# Patient Record
Sex: Female | Born: 1960 | Race: White | Hispanic: No | Marital: Married
Health system: Midwestern US, Academic
[De-identification: ages and names within clinical notes are randomized; demographics above are authoritative.]

---

## 2019-03-31 ENCOUNTER — Encounter: Admit: 2019-03-31 | Discharge: 2019-03-31

## 2019-03-31 NOTE — Progress Notes
Records Request    Medical records request for continuation of care:    Patient has appointment on 04/05/2019   with  Dr. Dedra Skeens* .    Please fax records to Cardiovascular Medicine-[phone]    Request records: STAT        Cardiac Office Note (Most recent)    EKG's        Cardiac Catheterization       Stress Test    Echocardiogram    Any Cardiac Testing    Any cardiac-related records    H&P/Discharge Summary- Any cardiac related    Operative Reports- Cardiac- Mitral Valve Repair operative report (October 2017)        Thank you,      Cardiovascular Medicine  [facility]  [address]  [place], [place] [address]  Phone:  [phone]  Fax:  [phone]

## 2019-04-04 ENCOUNTER — Encounter: Admit: 2019-04-04 | Discharge: 2019-04-04

## 2019-04-04 DIAGNOSIS — G35 Multiple sclerosis: Secondary | ICD-10-CM

## 2019-04-04 DIAGNOSIS — F329 Major depressive disorder, single episode, unspecified: Secondary | ICD-10-CM

## 2019-04-04 DIAGNOSIS — E0500 Thyrotoxicosis with diffuse goiter without thyrotoxic crisis or storm: Secondary | ICD-10-CM

## 2019-04-04 DIAGNOSIS — Z9889 Other specified postprocedural states: Secondary | ICD-10-CM

## 2019-04-05 ENCOUNTER — Encounter: Admit: 2019-04-05 | Discharge: 2019-04-05

## 2019-04-05 ENCOUNTER — Ambulatory Visit: Admit: 2019-04-05 | Discharge: 2019-04-06

## 2019-04-05 DIAGNOSIS — Z9889 Other specified postprocedural states: Secondary | ICD-10-CM

## 2019-04-05 DIAGNOSIS — R0600 Dyspnea, unspecified: Secondary | ICD-10-CM

## 2019-04-05 DIAGNOSIS — F329 Major depressive disorder, single episode, unspecified: Secondary | ICD-10-CM

## 2019-04-05 DIAGNOSIS — G35 Multiple sclerosis: Secondary | ICD-10-CM

## 2019-04-05 DIAGNOSIS — I10 Essential (primary) hypertension: Secondary | ICD-10-CM

## 2019-04-05 DIAGNOSIS — E0500 Thyrotoxicosis with diffuse goiter without thyrotoxic crisis or storm: Secondary | ICD-10-CM

## 2019-04-05 NOTE — Progress Notes
Date of Service: 04/05/2019    Kristina Wong is a 58 y.o. female.       HPI     Kristina Wong is establishing care in the [place] area after previously living in [place].  She previously underwent mitral valve repair on August 04, 2016.  This involved use of a 27 mm Medtronic Durand Ancore annuloplasty ring with a mini sternotomy.  She is stable and reports no new symptoms.  She does report chronic dyspnea with exertion but this has been present unchanged for years.  She can walk a mile at a time and occasionally develops mild dyspnea with exertion on her walks.  She reports no congestive symptoms such as orthopnea, nocturnal dyspnea, or increase in edema.  For years she has noted trace to 1+ bilateral pedal edema which accumulates during the day.  She has not had to use (as needed) furosemide for many months.  Otherwise, The patient has been doing well and reports no angina, congestive symptoms, palpitations, sensation of sustained forceful heart pounding, lightheadedness or syncope.  Her exercise tolerance has been stable. The patient reports no claudication, myalgias, bleeding abnormalities, neurologic motor abnormalities or difficulty with speech.        Vitals:    04/05/19 1113 04/05/19 1132   BP: 116/84 122/88   BP Source: Arm, Left Upper Arm, Right Upper   Pulse: 74    SpO2: 98%    Weight: 94.8 kg (209 lb)    Height: 1.575 m (5' 2)    PainSc: Zero      Body mass index is 38.23 kg/m???.     Past Medical History  Patient Active Problem List    Diagnosis Date Noted   ??? Hypothyroid 04/04/2019   ??? Hyperlipemia 04/04/2019   ??? Essential hypertension 04/04/2019   ??? Generalized anxiety disorder 04/04/2019   ??? Chest pain 04/04/2019     07/14/16 Cardiac Cath:  Normal coronary artery anatomy.  Normal left ventricular systolic function.  Minimal elevation of right heart pressures.  By ventriculography, moderate mitral valve regurgitation       ??? Graves disease 04/04/2019   ??? Depression 04/04/2019 ??? Multiple sclerosis (HCC) 04/04/2019   ??? 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 Sherral Hammers           Review of Systems   Constitution: Negative.   HENT: Positive for tinnitus.    Eyes: Negative.    Cardiovascular: Negative.    Respiratory: Negative.    Endocrine: Negative.    Hematologic/Lymphatic: Bruises/bleeds easily.   Skin: Negative.    Musculoskeletal: Positive for arthritis and joint pain.   Gastrointestinal: Positive for heartburn.   Genitourinary: Negative.    Neurological: Negative.    Psychiatric/Behavioral: Negative.    Allergic/Immunologic: Positive for environmental allergies.       Physical Exam  GENERAL: The patient is well developed, well nourished, resting comfortably and in no distress.   HEENT: No abnormalities of the visible oro-nasopharynx, conjunctiva or sclera are noted.  NECK: There is no jugular venous distension. Carotids are palpable and without bruits. There is no thyroid enlargement.  Chest: Lung fields are clear to auscultation. There are no wheezes or crackles.  CV: There is a regular rhythm. The first and second heart sounds are normal.  A soft apical systolic murmur is heard.  There are no diastolic murmurs, gallops or rubs.  ABD: The  abdomen is soft and supple with normal bowel sounds. There is no hepatosplenomegaly, ascites, tenderness, masses or bruits.  Neuro: There are no focal motor defects. Ambulation is normal. Cognitive function appears normal.  Ext: There is trace to 1+ bipedal edema without evidence of deep vein thrombosis. Peripheral pulses are satisfactory.    SKIN: There are no rashes and no cellulitis  PSYCH: The patient is calm, rationale and oriented.    Cardiovascular Studies  A twelve-lead ECG was obtained on 04/05/2019 reveals normal sinus rhythm with a heart rate of 66 bpm.  Nonspecific ST-T wave abnormalities are noted that appear to show no significant difference when compared with her prior ECG obtained on 03/03/2019.    An outside echo Doppler study performed on 09/17/2016 following mitral valve repair revealed 1) left ventricular chamber and wall dimensions are normal.  2) the calculated left ventricular ejection fraction is 55%.  3) left ventricular systolic function is normal.  4) normal left atrial pressure and diastolic function.  5) mild mitral valve regurgitation without mitral valve stenosis.    Outside heart catheterization and coronary angiography performed on 07/14/2016 prior to mitral valve repair revealed: 1) normal coronary artery anatomy.  2) normal left ventricular systolic function.  3) minimal elevation of right heart pressures.  The right atrial pressure was 15 mmHg.  The right ventricular pressure was 43/13 mmHg.  The right pulmonary artery pressure was 42/23 mmHg with a mean of 31 mmHg.  The pulmonary catheter wedge pressure was 16 mmHg.  Left ventricular end-diastolic pressure was 19 mmHg.  The cardiac output by Fick determination was 6.8 L/min and by thermodilution 7.3 L/min. 4) moderate mitral valve regurgitation.    Problems Addressed Today  Encounter Diagnoses   Name Primary?   ??? S/P mitral valve repair Yes   ??? Essential hypertension    ??? DOE (dyspnea on exertion)        Assessment and Plan     Kristina Wong reports chronic dyspnea with exertion.  This may be related to deconditioning or her weight.  However, in order to evaluate her exercise tolerance and to assess for structural cardiac abnormalities, I have asked her to obtain an exercise echo Doppler study as soon as this test is available according to current ASE recommendations and hospital policy.  This test is not currently being performed according to COVID-19 pandemic recommendations.  I have asked her to return for follow-up in approximately 3 months time to follow her progress.   .       Current Medications (including today's revisions) ??? albuterol sulfate (PROAIR HFA) 90 mcg/actuation aerosol inhaler Inhale 2 puffs by mouth into the lungs as Needed.   ??? aspirin 325 mg tablet Take 325 mg by mouth daily. Take with food.   ??? atorvastatin (LIPITOR) 20 mg tablet Take 20 mg by mouth daily.   ??? bisacodyL (DULCOLAX) 5 mg tablet Take 5 mg by mouth every 24 hours as needed for Constipation.   ??? cholecalciferol (VITAMIN D-3) 1,000 units tablet Take 1,000 Units by mouth daily.   ??? desvenlafaxine (PRISTIQ) 50 mg tablet Take 50 mg by mouth daily.   ??? furosemide (LASIX) 20 mg tablet Take 20 mg by mouth daily as needed.   ??? levocetirizine 5 mg tab Take 1 tablet by mouth daily.   ??? levothyroxine (SYNTHROID) 100 mcg tablet Take 100 mcg by mouth daily 30 minutes before breakfast.   ??? meloxicam (MOBIC) 7.5 mg tablet Take 7.5 mg by mouth daily.   ???  metoprolol XL (TOPROL XL) 100 mg extended release tablet Take 100 mg by mouth daily.   ??? omeprazole DR (PRILOSEC) 40 mg capsule Take 40 mg by mouth daily before breakfast.   ??? polyethylene glycol 3350 (MIRALAX) 17 g packet Take 17 g by mouth as Needed.   ??? potassium chloride SR (K-DUR) 10 mEq tablet Take 10 mEq by mouth as Needed. Take with a meal and a full glass of water.   ??? tolterodine LA (DETROL LA) 4 mg capsule Take 4 mg by mouth daily.   ??? valsartan (DIOVAN) 80 mg tablet Take 80 mg by mouth daily.

## 2019-04-06 ENCOUNTER — Encounter: Admit: 2019-04-06 | Discharge: 2019-04-06

## 2019-04-07 ENCOUNTER — Encounter: Admit: 2019-04-07 | Discharge: 2019-04-07

## 2019-04-07 DIAGNOSIS — E785 Hyperlipidemia, unspecified: Secondary | ICD-10-CM

## 2019-04-07 DIAGNOSIS — R0600 Dyspnea, unspecified: Secondary | ICD-10-CM

## 2019-04-07 DIAGNOSIS — I10 Essential (primary) hypertension: Secondary | ICD-10-CM

## 2019-04-07 DIAGNOSIS — F411 Generalized anxiety disorder: Secondary | ICD-10-CM

## 2019-04-07 NOTE — Telephone Encounter
-----   Message from [facility] sent at 04/07/2019  2:22 PM CDT -----  Regarding: prob w/order  She needs to have the stress part of the order added, I was going to sched it & saw the note & central sched will only do the rest part since that is the only order in there, now they have to have both.

## 2019-05-03 ENCOUNTER — Encounter: Admit: 2019-05-03 | Discharge: 2019-05-03

## 2019-05-05 NOTE — Telephone Encounter
Pt still COVID positive.  She will be retested next week.  Stress test scheduled 7/22.  I advised pt we will need one negative test 24-48 hours prior,  asymptomatic and afebrile before test can be completed. Pt verbalizes understanding.

## 2019-05-13 ENCOUNTER — Encounter: Admit: 2019-05-13 | Discharge: 2019-05-13

## 2019-05-13 NOTE — Telephone Encounter
Pt called to say her COVID19 test she had yesterday was negative. She has been asked to go back for a repeat tomorrow 7/18. She is wondering if she still needs a COVID test for [place] on 7/20 for her ex echo on 7/22. I told her she does not need to get COVID test on 7/20 if she can quarantine until after the stress test is done. She says she can do that.

## 2019-05-18 ENCOUNTER — Ambulatory Visit: Admit: 2019-05-18 | Discharge: 2019-05-18

## 2019-05-18 ENCOUNTER — Encounter: Admit: 2019-05-18 | Discharge: 2019-05-18

## 2019-05-18 ENCOUNTER — Ambulatory Visit: Admit: 2019-05-18 | Discharge: 2019-05-19

## 2019-05-18 DIAGNOSIS — I10 Essential (primary) hypertension: Secondary | ICD-10-CM

## 2019-05-18 DIAGNOSIS — R0600 Dyspnea, unspecified: Secondary | ICD-10-CM

## 2019-05-18 DIAGNOSIS — E785 Hyperlipidemia, unspecified: Secondary | ICD-10-CM

## 2019-05-18 DIAGNOSIS — F411 Generalized anxiety disorder: Secondary | ICD-10-CM

## 2019-05-18 MED ORDER — PERFLUTREN LIPID MICROSPHERES 1.1 MG/ML IV SUSP
1-20 mL | Freq: Once | INTRAVENOUS | 0 refills | Status: CP | PRN
Start: 2019-05-18 — End: ?

## 2019-05-19 ENCOUNTER — Encounter: Admit: 2019-05-19 | Discharge: 2019-05-19

## 2019-05-19 NOTE — Telephone Encounter
-----   Message from Nehemiah Massed, MD sent at 05/19/2019  9:33 AM CDT -----  Sherlon Handing: Her echo looks pretty good.  Only trace mitral valve regurgitation.  Her mean mitral diastolic gradient is 7 mmHg.  It requires no action and we can watch this over time.  Please let her know.  Thanks.  SBG  ----- Message -----  From: Nehemiah Massed, MD  Sent: 05/19/2019   9:33 AM CDT  To: Nehemiah Massed, MD

## 2019-05-19 NOTE — Telephone Encounter
Results and recommendations called to patient.              Kristina Wong and Kristina Wong. Her stress test was submaximal although no major abnormalities were reported. Please let her know. Thanks. SBG

## 2019-07-14 ENCOUNTER — Encounter: Admit: 2019-07-14 | Discharge: 2019-07-14

## 2019-07-19 ENCOUNTER — Encounter: Admit: 2019-07-19 | Discharge: 2019-07-19

## 2019-07-19 ENCOUNTER — Ambulatory Visit: Admit: 2019-07-19 | Discharge: 2019-07-20

## 2019-07-19 NOTE — Progress Notes
Date of Service: 07/19/2019    Kristina Wong is a 58 y.o. female.       HPI     Ms. Wold recently moved to the [facility] area after previously living in [place].    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  She later tested negative in July 2020.   Currently, she is stable and reports no new symptoms.  She does report chronic dyspnea with exertion but this has been present unchanged for years.  She can walk a mile at a time and occasionally develops mild dyspnea with exertion on her walks.  She reports no congestive symptoms such as orthopnea, nocturnal dyspnea, or increase in edema.  For years she has noted trace to 1+ bilateral pedal edema which accumulates during the day.  She has not had to use (as needed) furosemide for many months.  Also she is hesitant to use supplemental furosemide because she has been told she cannot take supplemental potassium for some reason. Otherwise, The patient has been doing well and reports no angina, congestive symptoms, palpitations, sensation of sustained forceful heart pounding, lightheadedness or syncope.  Her exercise tolerance has been stable. The patient reports no claudication, myalgias, bleeding abnormalities, neurologic motor abnormalities or difficulty with speech.   Historically, Ms. Ramos previously underwent mitral valve repair on August 04, 2016.  This involved use of a 27 mm Medtronic Durand Ancore annuloplasty ring with a mini sternotomy.        Vitals:    07/19/19 0902 07/19/19 0910   BP: 116/72 126/78   BP Source: Arm, Left Upper Arm, Right Upper   Pulse: 74    Temp: 36.6 ?C (97.9 ?F)    SpO2: 97%    Weight: 98.1 kg (216 lb 3.2 oz)    Height: 1.6 m (5' 3)    PainSc: Zero      Body mass index is 38.3 kg/m?Marland Kitchen     Past Medical History  Patient Active Problem List    Diagnosis Date Noted   ? Hypothyroid 04/04/2019   ? Hyperlipemia 04/04/2019   ? Essential hypertension 04/04/2019 ? Generalized anxiety disorder 04/04/2019   ? Chest pain 04/04/2019     07/14/16 Cardiac Cath:  Normal coronary artery anatomy.  Normal left ventricular systolic function.  Minimal elevation of right heart pressures.  By ventriculography, moderate mitral valve regurgitation       ? Graves disease 04/04/2019   ? Depression 04/04/2019   ? Multiple sclerosis (HCC) 04/04/2019   ? 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 Sherral Hammers           Review of Systems   Constitution: Positive for malaise/fatigue.   HENT: Positive for tinnitus.    Eyes: Negative.    Cardiovascular: Positive for dyspnea on exertion and leg swelling.   Respiratory: Positive for wheezing.    Endocrine: Negative.    Hematologic/Lymphatic: Bruises/bleeds easily.   Skin: Negative.    Musculoskeletal: Positive for arthritis.   Gastrointestinal: Positive for constipation.   Genitourinary: Negative.    Neurological: Positive for numbness and paresthesias.   Psychiatric/Behavioral: Negative.    Allergic/Immunologic: Negative.        Physical Exam  GENERAL: The patient is well developed, well nourished, resting comfortably and in no distress.   HEENT: No abnormalities of the visible oro-nasopharynx, conjunctiva or sclera are  noted.  NECK: There is no jugular venous distension. Carotids are palpable and without bruits. There is no thyroid enlargement.  Chest: Lung fields are clear to auscultation. There are no wheezes or crackles.  CV: There is a regular rhythm. The first and second heart sounds are normal.  A soft apical systolic murmur is heard.  A soft diastolic rumble is also heard. There are no gallops or rubs.  ABD: The abdomen is soft and supple with normal bowel sounds. There is no hepatosplenomegaly, ascites, tenderness, masses or bruits.  Neuro: There are no focal motor defects. Ambulation is normal. Cognitive function appears normal. Ext: There is trace to 1+ bipedal edema without evidence of deep vein thrombosis. Peripheral pulses are satisfactory.    SKIN: There are no rashes and no cellulitis  PSYCH: The patient is calm, rationale and oriented    Cardiovascular Studies  A twelve-lead ECG was obtained on 04/05/2019 reveals normal sinus rhythm with a heart rate of 66 bpm.  Nonspecific ST-T wave abnormalities are noted that appear to show no significant difference when compared with her prior ECG obtained on 03/03/2019.  ?  An outside echo Doppler study performed on 09/17/2016 following mitral valve repair revealed 1) left ventricular chamber and wall dimensions are normal.  2) the calculated left ventricular ejection fraction is 55%.  3) left ventricular systolic function is normal.  4) normal left atrial pressure and diastolic function.  5) mild mitral valve regurgitation without mitral valve stenosis.  ?  Outside heart catheterization and coronary angiography performed on 07/14/2016 prior to mitral valve repair revealed: 1) normal coronary artery anatomy.  2) normal left ventricular systolic function.  3) minimal elevation of right heart pressures.  The right atrial pressure was 15 mmHg.  The right ventricular pressure was 43/13 mmHg.  The right pulmonary artery pressure was 42/23 mmHg with a mean of 31 mmHg.  The pulmonary catheter wedge pressure was 16 mmHg.  Left ventricular end-diastolic pressure was 19 mmHg.  The cardiac output by Fick determination was 6.8 L/min and by thermodilution 7.3 L/min. 4) moderate mitral valve regurgitation.    Echo Doppler 05/18/2019:  1. No regional wall motion abnormalities are seen. Overall LV systolic function appears normal. The estimated left ventricular ejection fraction is 60%.  2. Grade II (moderate) left ventricular diastolic dysfunction. Elevated left atrial pressure.  3. The right ventricle is not visualized well. Limited views suggest normal right ventricular size and contractility. 4. Left atrial chamber dimension appear grossly normal.  5. Prior mitral valve annuloplasty repair is noted.  The mean mitral diastolic gradient is approximately 7 mmHg suggesting mild mitral valve stenosis following mitral valve repair.  Trace mitral valve regurgitation is noted by Doppler exam.  6. No pericardial effusion is seen.    Stress echo 05/18/2019:  Baseline Information    Baseline HR  69 bpm       Baseline BP - Sys  124 mmHg       Baseline BP - Dias  70 mmHg       Percent HR  90 %       Target HR  147        Stress Information    Peak HR  97 bpm       Peak BP - Sys  132 mmHg       Peak BP - Dias  72       Percent of predicted max HR  60 %       Exercise  duration (min)  4 min       Exercise duration (sec)  3 sec       Estimated workload  7 METS          ? Technically difficult study; i.v. transpulmonary contrast was used to define the endocardial borders.  ? Normal left ventricular systolic function, estimated ejection fraction is 65 %.  ? Baseline EKG demonstrated normal sinus rhythm, no ST segment deviation noted during stress, the stress EKG is negative for ischemia.  ? Duke Treadmill score is 4, moderate risk, estimated 1 year mortality 1.0-1.1%.  ? This is a submaximal stress echo, patient attained only 60% of the maximum predicted target heart rate for age. No evidence of wall motion abnormalities at the workload achieved.  Low risk for cardiovascular complications      Problems Addressed Today  Mitral valve stenosis.  Dyspnea with exertion.  Assessment and Plan     Ms. Olsson has chronic dyspnea with exertion but it is stable and mild and she remains functional.  She has some degree of mitral valve stenosis following mitral valve repair with a resting mitral diastolic gradient of approximately 7 mmHg.  She does not require intervention at this time, especially since the technology for nonsurgical treatment of mitral valve disease is constantly evolving.  Some of her dyspnea with exertion may also be due to her weight and deconditioning.  I have asked her to return for follow-up in 6 months time.         Current Medications (including today's revisions)  ? albuterol sulfate (PROAIR HFA) 90 mcg/actuation aerosol inhaler Inhale 2 puffs by mouth into the lungs as Needed.   ? aspirin EC 81 mg tablet Take 81 mg by mouth daily. Take with food.   ? atorvastatin (LIPITOR) 20 mg tablet Take 20 mg by mouth daily.   ? bisacodyL (DULCOLAX) 5 mg tablet Take 5 mg by mouth every 24 hours as needed for Constipation.   ? cholecalciferol (VITAMIN D-3) 1,000 units tablet Take 1,000 Units by mouth daily.   ? desvenlafaxine (PRISTIQ) 50 mg tablet Take 50 mg by mouth daily.   ? furosemide (LASIX) 20 mg tablet Take 20 mg by mouth daily as needed.   ? levocetirizine 5 mg tab Take 1 tablet by mouth daily.   ? levothyroxine (SYNTHROID) 100 mcg tablet Take 100 mcg by mouth daily 30 minutes before breakfast.   ? meloxicam (MOBIC) 7.5 mg tablet Take 7.5 mg by mouth daily.   ? metoprolol XL (TOPROL XL) 100 mg extended release tablet Take 100 mg by mouth daily.   ? omeprazole DR (PRILOSEC) 40 mg capsule Take 40 mg by mouth daily before breakfast.   ? polyethylene glycol 3350 (MIRALAX) 17 g packet Take 17 g by mouth as Needed.   ? potassium chloride SR (K-DUR) 10 mEq tablet Take 10 mEq by mouth as Needed. Take with a meal and a full glass of water.   ? tolterodine LA (DETROL LA) 4 mg capsule Take 4 mg by mouth daily.   ? valsartan (DIOVAN) 80 mg tablet Take 80 mg by mouth daily.

## 2020-01-26 ENCOUNTER — Encounter: Admit: 2020-01-26 | Discharge: 2020-01-26

## 2020-01-26 DIAGNOSIS — I10 Essential (primary) hypertension: Secondary | ICD-10-CM

## 2020-01-26 DIAGNOSIS — F329 Major depressive disorder, single episode, unspecified: Secondary | ICD-10-CM

## 2020-01-26 DIAGNOSIS — E785 Hyperlipidemia, unspecified: Secondary | ICD-10-CM

## 2020-01-26 DIAGNOSIS — R079 Chest pain, unspecified: Secondary | ICD-10-CM

## 2020-01-26 DIAGNOSIS — Z9889 Other specified postprocedural states: Secondary | ICD-10-CM

## 2020-01-26 DIAGNOSIS — E0500 Thyrotoxicosis with diffuse goiter without thyrotoxic crisis or storm: Secondary | ICD-10-CM

## 2020-01-26 DIAGNOSIS — G35 Multiple sclerosis: Secondary | ICD-10-CM

## 2020-01-26 LAB — BASIC METABOLIC PANEL
Lab: 0.8
Lab: 10
Lab: 100
Lab: 108 — ABNORMAL HIGH (ref 98–107)
Lab: 140
Lab: 25 — ABNORMAL HIGH (ref 9.8–20.1)
Lab: 26
Lab: 4.4
Lab: 74
Lab: 8.8

## 2020-01-26 NOTE — Progress Notes
Date of Service: 01/26/2020    Kristina Wong is a 59 y.o. female.       HPI     Kristina Wong?recently moved to the [facility] area after previously living in [place]. ?  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  She later tested negative in July 2020.? Early in the morning of January 17, 2020 she woke up with vague sensation in her torso.  This sensation was mild and difficult for her to characterize and was not associated with dyspnea, diaphoresis or lightheadedness.  It was not suggestive for indigestion and was not pleuritic, positional or tender to touch.  This sensation was generally present throughout her entire torso and perhaps abdomen as well.  It did not radiate.  It persisted for approximately 10 minutes and resolved spontaneously.  It was not very concerning to her and she did not seek medical attention.  It has not recurred.  Currently, she is stable and reports no new symptoms. ?She does report chronic dyspnea with exertion but this has been present unchanged for years. ?She can walk a mile at a time and she occasionally?develops mild dyspnea with exertion on her walks.  She does believe that her strength and stamina is slowly improving following recovery from Covid.  She has now received her Covid vaccines as well. ?She reports no congestive symptoms such as orthopnea, nocturnal dyspnea, or increase in edema. ?For years she has noted trace to 1+ bilateral pedal edema which accumulates during the day.??She uses a furosemide 2-3 times per month for fluid retention. Otherwise, the patient has been doing well and reports no congestive symptoms, palpitations, sensation of sustained forceful heart pounding, lightheadedness or syncope.??Her exercise tolerance has been stable. The patient reports no?claudication,?myalgias, bleeding abnormalities, or strokelike symptoms.  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 ?This involved use of a 27 mm Medtronic Durand Ancore annuloplasty ring with a mini sternotomy.          Vitals:    01/26/20 0817   BP: 128/86   BP Source: Arm, Left Upper   Patient Position: Sitting   Pulse: 68   SpO2: 98%   Weight: 95.7 kg (211 lb)   Height: 1.6 m (5' 3)   PainSc: Zero     Body mass index is 37.38 kg/m?Marland Kitchen     Past Medical History  Patient Active Problem List    Diagnosis Date Noted   ? Hypothyroid 04/04/2019   ? Hyperlipemia 04/04/2019   ? Essential hypertension 04/04/2019   ? Generalized anxiety disorder 04/04/2019   ? Chest pain 04/04/2019     07/14/16 Cardiac Cath:  Normal coronary artery anatomy.  Normal left ventricular systolic function.  Minimal elevation of right heart pressures.  By ventriculography, moderate mitral valve regurgitation       ? Graves disease 04/04/2019   ? Depression 04/04/2019   ? Multiple sclerosis (HCC) 04/04/2019   ? 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 Sherral Hammers           Review of Systems   Constitution: Negative.   HENT: Negative.    Eyes: Negative.    Cardiovascular: Positive for dyspnea on exertion and leg swelling.   Endocrine: Negative.    Hematologic/Lymphatic: Negative.    Skin: Negative.    Musculoskeletal: Positive for arthritis and back  pain.   Gastrointestinal: Positive for constipation and heartburn.   Genitourinary: Negative.    Neurological: Positive for loss of balance.   Psychiatric/Behavioral: Negative.    Allergic/Immunologic: Negative.        Physical Exam  GENERAL: The patient is well developed, well nourished, resting comfortably and in no distress.   HEENT: No abnormalities of the visible oro-nasopharynx, conjunctiva or sclera are noted.  NECK: There is no jugular venous distension. Carotids are palpable and without bruits. There is no thyroid enlargement.  Chest: Lung fields are clear to auscultation. There are no wheezes or crackles.  CV: There is a regular rhythm. The first and second heart sounds are normal.??A soft apical systolic murmur is heard.  A soft diastolic rumble is also heard.?There are no?gallops or rubs.  ABD: The abdomen is soft and supple with normal bowel sounds. There is no hepatosplenomegaly, ascites, tenderness, masses or bruits.  Neuro: There are no focal motor defects. Ambulation is normal. Cognitive function appears normal.  Ext:?There is trace to 1+ bipedal?edema?without?evidence of deep vein thrombosis. Peripheral pulses are satisfactory. ?  SKIN:?There are no rashes and no cellulitis  PSYCH:?The patient is calm, rationale and oriented    Cardiovascular Studies  A twelve-lead ECG obtained on 01/26/2020 reveals normal sinus rhythm with a heart rate of 70 bpm.  There is no evidence for myocardial ischemia or infarction.  Labs from July 28, 2019 revealed serum potassium 3.3 mmol/L and serum creatinine 0.93 mg/dL.    An outside echo Doppler study performed on 09/17/2016 following mitral valve repair revealed 1) left ventricular chamber and wall dimensions are normal. ?2) the calculated left ventricular ejection fraction is 55%. ?3) left ventricular systolic function is normal. ?4) normal left atrial pressure and diastolic function. ?5) mild mitral valve regurgitation without mitral valve stenosis.  ?  Outside heart catheterization and coronary angiography performed on 07/14/2016 prior to mitral valve repair revealed: 1) normal coronary artery anatomy. ?2) normal left ventricular systolic function. ?3) minimal elevation of right heart pressures. ?The right atrial pressure was 15 mmHg. ?The right ventricular pressure was 43/13 mmHg. ?The right pulmonary artery pressure was 42/23 mmHg with a mean of 31 mmHg. ?The pulmonary catheter wedge pressure was 16 mmHg. ?Left ventricular end-diastolic pressure was 19 mmHg. ?The cardiac output by Fick determination was 6.8 L/min and by thermodilution 7.3 L/min.?4)?moderate mitral valve regurgitation.  ?  Echo Doppler 05/18/2019:  1. No regional wall motion abnormalities are seen. Overall LV systolic function appears normal. The estimated left ventricular ejection fraction is 60%.  2. Grade II (moderate) left ventricular diastolic dysfunction. Elevated left atrial pressure.  3. The right ventricle is not visualized well. Limited views suggest normal right ventricular size and contractility.  4. Left atrial chamber dimension appear grossly normal.  5. Prior mitral valve annuloplasty repair is noted. ?The mean mitral diastolic gradient is approximately 7 mmHg suggesting mild mitral valve stenosis following mitral valve repair. ?Trace mitral valve regurgitation is noted by Doppler exam.  6. No pericardial effusion is seen.  ?  Stress echo 05/18/2019:       Baseline Information     Baseline HR  69 bpm       Baseline BP - Sys  124 mmHg       Baseline BP - Dias  70 mmHg       Percent HR  90 %       Target HR  147        Stress Information  Peak HR  97 bpm       Peak BP - Sys  132 mmHg       Peak BP - Dias  72       Percent of predicted max HR  60 %       Exercise duration (min)  4 min       Exercise duration (sec)  3 sec       Estimated workload  7 METS          ? Technically difficult study; i.v. transpulmonary contrast was used to define the endocardial borders.  ? Normal left ventricular systolic function, estimated ejection fraction is 65 %.  ? Baseline EKG demonstrated normal sinus rhythm, no ST segment deviation noted during stress, the stress EKG is negative for ischemia.  ? Duke Treadmill score is 4, moderate risk, estimated 1 year mortality 1.0-1.1%.  ? This is a submaximal stress echo, patient attained only 60% of the maximum predicted target heart rate for age. No evidence of wall motion abnormalities at the workload achieved.  Low risk for cardiovascular complications    Problems Addressed Today  Encounter Diagnoses   Name Primary?   ? Chest pain, unspecified type Yes   ? Essential hypertension    ? Hyperlipidemia, unspecified hyperlipidemia type        Assessment and Plan     Ms. Mckain had today a vague nondiagnostic chest sensation on January 17, 2020 that has not recurred.  Alternatives for further evaluation were reviewed with the patient and she wanted to obtain a regadenoson thallium stress test to assess for objective evidence of myocardial ischemia.  I have asked that the study be obtained within the next week.  Her prior stress test was submaximal due to low treadmill performance.Ms. Lueth has chronic dyspnea with exertion but it is stable and mild and she remains functional.  She has some degree of mitral valve stenosis following mitral valve repair with a resting mitral diastolic gradient of approximately 7 mmHg.  She does not require intervention at this time, especially since the technology for nonsurgical treatment of mitral valve disease is constantly evolving.  Some of her dyspnea with exertion may also be due to her weight and deconditioning.  She was given a requisition to obtain a Chem-7 since she infrequently takes furosemide. I have asked her to return for follow-up in 3 months time.         Current Medications (including today's revisions)  ? albuterol sulfate (PROAIR HFA) 90 mcg/actuation aerosol inhaler Inhale 2 puffs by mouth into the lungs as Needed.   ? aspirin EC 81 mg tablet Take 81 mg by mouth daily. Take with food.   ? atorvastatin (LIPITOR) 20 mg tablet Take 20 mg by mouth daily.   ? bisacodyL (DULCOLAX) 5 mg tablet Take 5 mg by mouth every 24 hours as needed for Constipation.   ? cholecalciferol (VITAMIN D-3) 1,000 units tablet Take 1,000 Units by mouth daily.   ? desvenlafaxine (PRISTIQ) 50 mg tablet Take 50 mg by mouth daily.   ? furosemide (LASIX) 20 mg tablet Take 20 mg by mouth daily as needed.   ? levocetirizine 5 mg tab Take 1 tablet by mouth daily.   ? levothyroxine (SYNTHROID) 100 mcg tablet Take 100 mcg by mouth daily 30 minutes before breakfast.   ? meloxicam (MOBIC) 7.5 mg tablet Take 7.5 mg by mouth daily.   ? metoprolol XL (TOPROL XL) 100 mg extended release tablet Take 100 mg  by mouth daily.   ? omeprazole DR (PRILOSEC) 40 mg capsule Take 40 mg by mouth daily before breakfast.   ? polyethylene glycol 3350 (MIRALAX) 17 g packet Take 17 g by mouth as Needed.   ? potassium chloride SR (K-DUR) 10 mEq tablet Take 10 mEq by mouth as Needed. Take with a meal and a full glass of water.   ? tolterodine LA (DETROL LA) 4 mg capsule Take 4 mg by mouth daily.   ? valsartan (DIOVAN) 80 mg tablet Take 80 mg by mouth daily.

## 2020-01-26 NOTE — Patient Instructions
Thank you for visiting our office today.    Continue the same medications as you have been doing.          We will be pursuing the following tests after your appointment today:       Orders Placed This Encounter   ? BASIC METABOLIC PANEL   ? ECG Today (all locations)   ? REGADENOSON MPI STRESS TEST         We will plan to see you back in 3   months.  Please call us in the meantime with any questions or concerns.        Please allow 5-7 business days for our providers to review your results. All normal results will go to MyChart. If you do not have Mychart, it is strongly recommended to get this so you can easily view all your results. If you do not have mychart, we will attempt to call you once with normal lab and testing results. If we cannot reach you by phone with normal results, we will send you a letter.  If you have not heard the results of your testing after one week please give [place] a call.       Your [facility] [place] Brett Canales, Pilar Jarvis and [place])  phone number is [phone].             [facility] Nuclear Stress Test Instructions    PLEASE REPORT TO:    ____[facility] ([address], [address], [place], [place]) - [phone]   ____[facility] ([address], [address], [place], [place]) - [phone]    ____[facility] ([address] [place], [place]) - [phone]    ____[facility] ([address], [address], [place], [place]) - [phone]   ____[facility] ([address] [place], [place]) - [phone]     [facility] Main Phone Number: [phone]     Date of Test  ____________  at ____________  for ________________________    Are you able to raise your arm up by your head for about 20 minutes? yes  Can you lie on your back for approximately 20 minutes with minimal movement? yes    The Thallium evaluation has two parts -- two nuclear scans.  The first scan is done in the morning and the second three to four hours later.   Wear comfortable clothing. Shorts or pants. (No dresses or skirts please).  Bring or wear sneakers/walking shoes if you are walking on Treadmill.  Please let the nuclear technologists know if you plan on flying after the test.    NO CAFFEINE 24 HOURS PRIOR TO TEST. Examples: coffee, tea, decaf drinks, cola, chocolate.     DO NOT EAT OR DRINK THE MORNING OF YOUR TEST unless otherwise instructed. (You may have a couple sips of water).    If you are a diabetic, if insulin dependent: please take one third of your insulin with two pieces of dry toast and a small juice). Bring insulin and medication with you to the test.     ___ TAKE MORNING MEDICATIONS WITH A COUPLE SIPS OF WATER PRIOR TO TEST.     HOLD THE FOLLOWING MEDICATIONS AS INDICATED BELOW:      ?       WHAT TO DO BETWEEN THE FIRST TWO THALLIUM SCANS:  1. No strenuous exercise should be performed during this time.  2. A light lunch is permissible. The technologist will give you  a list of appropriate foods.  3. Please return 15 minutes prior to the schedule of your second scan. Our nuclear technologist will tell you exactly what time to return.  4. Please do not use tobacco products in between scans.  5. After the first scan is completed, you may resume usual medications.     TEST FINDINGS:  You will receive the results of the test within 7 business days of its completion by telephone, unless arranged differently at the time of the procedure.  If you have any questions concerning your thallium test or if you do not hear from your [facility] physician/or nurse within 7 business days, please call the appropriate office checked above.      Instructions given by Fulton Reek, RN

## 2020-01-30 ENCOUNTER — Encounter: Admit: 2020-01-30 | Discharge: 2020-01-30

## 2020-01-30 NOTE — Telephone Encounter
Called patient with stress test instructions who gave verbal understanding. NPO the morning of the exam and no caffeine for the 24 hours prior.

## 2020-02-01 ENCOUNTER — Ambulatory Visit: Admit: 2020-02-01 | Discharge: 2020-02-01

## 2020-02-01 ENCOUNTER — Encounter: Admit: 2020-02-01 | Discharge: 2020-02-01

## 2020-02-01 DIAGNOSIS — R079 Chest pain, unspecified: Secondary | ICD-10-CM

## 2020-02-01 DIAGNOSIS — I10 Essential (primary) hypertension: Secondary | ICD-10-CM

## 2020-02-01 DIAGNOSIS — E785 Hyperlipidemia, unspecified: Secondary | ICD-10-CM

## 2020-02-01 MED ORDER — REGADENOSON 0.4 MG/5 ML IV SYRG
.4 mg | Freq: Once | INTRAVENOUS | 0 refills | Status: CP
Start: 2020-02-01 — End: ?

## 2020-02-01 MED ORDER — EUCALYPTUS-MENTHOL MM LOZG
1 | Freq: Once | ORAL | 0 refills | Status: AC | PRN
Start: 2020-02-01 — End: ?

## 2020-02-01 MED ORDER — AMINOPHYLLINE 500 MG/20 ML IV SOLN
50 mg | INTRAVENOUS | 0 refills | Status: AC | PRN
Start: 2020-02-01 — End: ?

## 2020-02-01 MED ORDER — NITROGLYCERIN 0.4 MG SL SUBL
.4 mg | SUBLINGUAL | 0 refills | Status: DC | PRN
Start: 2020-02-01 — End: 2020-02-06

## 2020-02-01 MED ORDER — SODIUM CHLORIDE 0.9 % IV SOLP
250 mL | INTRAVENOUS | 0 refills | Status: AC | PRN
Start: 2020-02-01 — End: ?

## 2020-02-01 MED ORDER — ALBUTEROL SULFATE 90 MCG/ACTUATION IN HFAA
2 | RESPIRATORY_TRACT | 0 refills | Status: DC | PRN
Start: 2020-02-01 — End: 2020-02-06

## 2020-02-02 ENCOUNTER — Encounter: Admit: 2020-02-02 | Discharge: 2020-02-02

## 2020-02-02 NOTE — Telephone Encounter
Results and recommendations called to patient.

## 2020-05-08 ENCOUNTER — Encounter: Admit: 2020-05-08 | Discharge: 2020-05-08

## 2020-05-08 DIAGNOSIS — G35 Multiple sclerosis: Secondary | ICD-10-CM

## 2020-05-08 DIAGNOSIS — I342 Nonrheumatic mitral (valve) stenosis: Secondary | ICD-10-CM

## 2020-05-08 DIAGNOSIS — E0500 Thyrotoxicosis with diffuse goiter without thyrotoxic crisis or storm: Secondary | ICD-10-CM

## 2020-05-08 DIAGNOSIS — Z9889 Other specified postprocedural states: Secondary | ICD-10-CM

## 2020-05-08 DIAGNOSIS — F329 Major depressive disorder, single episode, unspecified: Secondary | ICD-10-CM

## 2020-05-08 NOTE — Progress Notes
Date of Service: 05/08/2020    Kristina Wong is a 59 y.o. female.       HPI     Kristina Wong?has been followed for mitral valve disease.??The patient is currently working at a retirement center and contracted COVID-19 in June 2020. ?She later tested negative in July 2020.? She has since received her Covid vaccines.  When I saw her on January 26, 2020 she reported nondiagnostic chest discomfort.  A stress test was obtained without significant abnormality.  Over the past 3 months she has had no recurrent chest discomfort.  She reports the majority of her blood pressure readings are less than 130/80 mmHg.  Otherwise, the patient has been doing well and reports no angina, congestive symptoms, palpitations, sensation of sustained forceful heart pounding, lightheadedness or syncope.  Her exercise tolerance has been stable, although she does not currently have an exercise routine. The patient reports no myalgias, bleeding abnormalities, or strokelike symptoms.  Historically,?Kristina Wong?previously underwent mitral valve repair on August 04, 2016. ?This involved use of a 27 mm Medtronic Durand Ancore annuloplasty ring with a mini sternotomy.?       Vitals:    05/08/20 1346 05/08/20 1347   BP: (!) 132/98 (!) 132/100   BP Source: Arm, Left Upper Arm, Right Upper   Patient Position: Sitting Sitting   Pulse: 79    SpO2: 98%    Weight: 93.4 kg (205 lb 12.8 oz)    Height: 1.6 m (5' 3)    PainSc: Four      Body mass index is 36.46 kg/m?Marland Kitchen     Past Medical History  Patient Active Problem List    Diagnosis Date Noted   ? Hypothyroid 04/04/2019   ? Hyperlipemia 04/04/2019   ? Essential hypertension 04/04/2019   ? Generalized anxiety disorder 04/04/2019   ? Chest pain 04/04/2019     07/14/16 Cardiac Cath:  Normal coronary artery anatomy.  Normal left ventricular systolic function.  Minimal elevation of right heart pressures.  By ventriculography, moderate mitral valve regurgitation       ? Graves disease 04/04/2019   ? Depression 04/04/2019 ? Multiple sclerosis (HCC) 04/04/2019   ? 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 Sherral Hammers           Review of Systems   Constitution: Negative.   HENT: Negative.    Eyes: Negative.    Cardiovascular: Negative.    Respiratory: Negative.    Endocrine: Negative.    Hematologic/Lymphatic: Negative.    Skin: Negative.    Musculoskeletal: Negative.    Gastrointestinal: Negative.    Genitourinary: Negative.    Neurological: Positive for dizziness.   Psychiatric/Behavioral: Negative.    Allergic/Immunologic: Negative.        Physical Exam  GENERAL: The patient is well developed, well nourished, resting comfortably and in no distress.   HEENT: No abnormalities of the visible oro-nasopharynx, conjunctiva or sclera are noted.  NECK: There is no jugular venous distension. Carotids are palpable and without bruits. There is no thyroid enlargement.  Chest: Lung fields are clear to auscultation. There are no wheezes or crackles.  CV: There is a regular rhythm. The first and second heart sounds are normal.??A soft apical systolic murmur is heard.??A soft diastolic rumble is also heard.?There are no?gallops or rubs.  ABD: The abdomen is soft and supple with normal bowel sounds. There is no hepatosplenomegaly, ascites, tenderness,  masses or bruits.  Neuro: There are no focal motor defects. Ambulation is normal. Cognitive function appears normal.  Ext:?There is trace bipedal?edema?without?evidence of deep vein thrombosis. Peripheral pulses are satisfactory. ?  SKIN:?There are no rashes and no cellulitis  PSYCH:?The patient is calm, rationale and oriented    Cardiovascular Studies  A twelve-lead ECG obtained on 01/26/2020 reveals normal sinus rhythm with a heart rate of 70 bpm.  There is no evidence for myocardial ischemia or infarction.  Labs from July 28, 2019 revealed serum potassium 3.3 mmol/L and serum creatinine 0.93 mg/dL.  ?  An outside echo Doppler study performed on 09/17/2016 following mitral valve repair revealed 1) left ventricular chamber and wall dimensions are normal. ?2) the calculated left ventricular ejection fraction is 55%. ?3) left ventricular systolic function is normal. ?4) normal left atrial pressure and diastolic function. ?5) mild mitral valve regurgitation without mitral valve stenosis.  ?  Outside heart catheterization and coronary angiography performed on 07/14/2016 prior to mitral valve repair revealed: 1) normal coronary artery anatomy. ?2) normal left ventricular systolic function. ?3) minimal elevation of right heart pressures. ?The right atrial pressure was 15 mmHg. ?The right ventricular pressure was 43/13 mmHg. ?The right pulmonary artery pressure was 42/23 mmHg with a mean of 31 mmHg. ?The pulmonary catheter wedge pressure was 16 mmHg. ?Left ventricular end-diastolic pressure was 19 mmHg. ?The cardiac output by Fick determination was 6.8 L/min and by thermodilution 7.3 L/min.?4)?moderate mitral valve regurgitation.  ?  Echo Doppler 05/18/2019:  1. No regional wall motion abnormalities are seen. Overall LV systolic function appears normal. The estimated left ventricular ejection fraction is 60%.  2. Grade II (moderate) left ventricular diastolic dysfunction. Elevated left atrial pressure.  3. The right ventricle is not visualized well. Limited views suggest normal right ventricular size and contractility.  4. Left atrial chamber dimension appear grossly normal.  5. Prior mitral valve annuloplasty repair is noted. ?The mean mitral diastolic gradient is approximately 7 mmHg suggesting mild mitral valve stenosis following mitral valve repair. ?Trace mitral valve regurgitation is noted by Doppler exam.  6. No pericardial effusion is seen.  ?  Stress echo 05/18/2019:  ? ? ?   Baseline Information  ?   Baseline HR  69 bpm       Baseline BP - Sys  124 mmHg       Baseline BP - Dias  70 mmHg       Percent HR  90 % Target HR  147             Stress Information  ?   Peak HR  97 bpm       Peak BP - Sys  132 mmHg       Peak BP - Dias  72       Percent of predicted max HR  60 %       Exercise duration (min)  4 min       Exercise duration (sec)  3 sec       Estimated workload  7 METS          ? Technically difficult study; i.v. transpulmonary contrast was used to define the endocardial borders.  ? Normal left ventricular systolic function, estimated ejection fraction is 65 %.  ? Baseline EKG demonstrated normal sinus rhythm, no ST segment deviation noted during stress, the stress EKG is negative for ischemia.  ? Duke Treadmill score is 4, moderate risk, estimated 1 year mortality 1.0-1.1%.  ? This is  a submaximal stress echo, patient attained only 60% of the maximum predicted target heart rate for age. No evidence of wall motion abnormalities at the workload achieved.  Low risk for cardiovascular complications    Regadenoson thallium stress test 02/01/2020:  Scintigraphic (planar/tomographic):   There is a very small sized mild intensity perfusion defect localized to the distal inferior inferolateral myocardial wall segment that is partially fixed partially reversible.  Limiting the sensitivity/specificity of this finding is increased extracardiac uptake in the territory of this perfusion defect.  There are no other perfusion defect detected.  All myocardial segments appear viable. Polar coordinate map identifies a similar perfusion defect as described above. TID Ratio:  1.13  (normal <1.36). Summed Stress Score:  4   , Summed Rest Score: Regional Wall Thickening and Motion Post Stress:  ?There is normal left ventricular wall motion and thickening of all myocardial segments inclusive of the perceived perfusion defect in above. Left Ventricular Ejection Fraction (post stress, in the resting state) =? 76 %. Left Ventricular End Diastolic Volume: 52 mL  SUMMARY/OPINION:??This study is probably normal with no evidence of significant myocardial ischemia.  There is demonstration of a very small sized very mild intensity perfusion defect localized to the distal/apical inferior inferolateral wall.  Limiting the specificity and sensitivity of this finding is the presence of increased extracardiac uptake in the territory of this perfusion defect.  Moreover, there is continued preservation of myocardial thickening and brightening raise suspicion that this is a attenuation artifact.  However, cannot rule out limited ischemia in this territory.  Left ventricular systolic function is normal. There are no high risk prognostic indicators present.  The pharmacologic ECG portion of the study is negative for ischemia. In aggregate the current study is low risk in regards to predicted annual cardiovascular mortality rate.    Problems Addressed Today  Mitral valve regurgitation  Assessment and Plan     Ms. Donlan reports that she is currently doing well and indicates that her blood pressure is well controlled, with the majority of ambulatory readings less than 130/80 mmHg.  She reports no chest discomfort or congestive symptoms. I have asked the patient to keep a log book of her BP readings and to report BP readings exceeding 130/80 mm Hg. Regular mild aerobic exercise, weight loss and adherence to a heart healthy diet were recommended. I have asked her to return for follow-up in 6 months.         Current Medications (including today's revisions)  ? albuterol sulfate (PROAIR HFA) 90 mcg/actuation aerosol inhaler Inhale 2 puffs by mouth into the lungs as Needed.   ? aspirin EC 81 mg tablet Take 81 mg by mouth daily. Take with food.   ? atorvastatin (LIPITOR) 20 mg tablet Take 20 mg by mouth daily.   ? bisacodyL (DULCOLAX) 5 mg tablet Take 5 mg by mouth every 24 hours as needed for Constipation.   ? cholecalciferol (VITAMIN D-3) 1,000 units tablet Take 1,000 Units by mouth daily.   ? desvenlafaxine (PRISTIQ) 50 mg tablet Take 50 mg by mouth daily.   ? furosemide (LASIX) 20 mg tablet Take 20 mg by mouth daily as needed.   ? levocetirizine 5 mg tab Take 1 tablet by mouth daily.   ? levothyroxine (SYNTHROID) 100 mcg tablet Take 100 mcg by mouth daily 30 minutes before breakfast.   ? meloxicam (MOBIC) 7.5 mg tablet Take 7.5 mg by mouth daily.   ? metoprolol XL (TOPROL XL) 100 mg extended release tablet  Take 100 mg by mouth daily.   ? omeprazole DR (PRILOSEC) 40 mg capsule Take 40 mg by mouth daily before breakfast.   ? polyethylene glycol 3350 (MIRALAX) 17 g packet Take 17 g by mouth as Needed.   ? potassium chloride SR (K-DUR) 10 mEq tablet Take 10 mEq by mouth as Needed. Take with a meal and a full glass of water.   ? tolterodine LA (DETROL LA) 4 mg capsule Take 4 mg by mouth daily.   ? valsartan (DIOVAN) 80 mg tablet Take 80 mg by mouth daily.

## 2021-02-28 ENCOUNTER — Encounter: Admit: 2021-02-28 | Discharge: 2021-02-28

## 2021-03-05 ENCOUNTER — Encounter: Admit: 2021-03-05 | Discharge: 2021-03-05

## 2021-03-05 DIAGNOSIS — F32A Depression: Secondary | ICD-10-CM

## 2021-03-05 DIAGNOSIS — I10 Essential (primary) hypertension: Secondary | ICD-10-CM

## 2021-03-05 DIAGNOSIS — Z9889 Other specified postprocedural states: Secondary | ICD-10-CM

## 2021-03-05 DIAGNOSIS — G35 Multiple sclerosis: Secondary | ICD-10-CM

## 2021-03-05 DIAGNOSIS — E0500 Thyrotoxicosis with diffuse goiter without thyrotoxic crisis or storm: Secondary | ICD-10-CM

## 2021-03-05 NOTE — Progress Notes
Date of Service: 03/05/2021    Kristina Wong is a 60 y.o. female.       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  She reports that she underwent laparoscopic cholecystectomy in March 2022 without complications. She reports the majority of her blood pressure readings are less than 130/80 mmHg.  Otherwise, the patient has been doing well and reports no angina, congestive symptoms, palpitations, sensation of sustained forceful heart pounding, lightheadedness or syncope.  Her exercise tolerance has been stable.  She is currently walking for 30 minutes on the treadmill 5 times a week at 3 mph and a 2 to 3% incline without any difficulty whatsoever. The patient reports no myalgias, bleeding abnormalities, claudication or strokelike symptoms.  Historically,?Kristina Wong?previously underwent mitral valve repair on August 04, 2016. ?This involved use of a 27 mm Medtronic Durand Ancore annuloplasty ring with a mini sternotomy.?Kristina Wong contracted COVID-19 in June 2020. ?She later tested negative in July 2020.? She has since received her Covid vaccines. When I saw her on January 26, 2020 she reported nondiagnostic chest discomfort.  A stress test was obtained without significant abnormality.        Vitals:    03/05/21 1209   BP: 110/72   Pulse: 63   SpO2: 98%   O2 Device: None (Room air)   PainSc: Zero   Weight: 89.4 kg (197 lb)   Height: 160 cm (5' 3)     Body mass index is 34.9 kg/m?Marland Kitchen     Past Medical History  Patient Active Problem List    Diagnosis Date Noted   ? Hypothyroid 04/04/2019   ? Hyperlipemia 04/04/2019   ? Essential hypertension 04/04/2019   ? Generalized anxiety disorder 04/04/2019   ? Chest pain 04/04/2019     07/14/16 Cardiac Cath:  Normal coronary artery anatomy.  Normal left ventricular systolic function.  Minimal elevation of right heart pressures.  By ventriculography, moderate mitral valve regurgitation       ? Graves disease 04/04/2019   ? Depression 04/04/2019   ? Multiple sclerosis (HCC) 04/04/2019   ? 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 Sherral Hammers           Review of Systems   Constitutional: Negative.   HENT: Positive for tinnitus.    Eyes: Negative.    Cardiovascular: Negative.    Respiratory: Negative.    Endocrine: Negative.    Hematologic/Lymphatic: Negative.    Skin: Negative.    Musculoskeletal: Positive for arthritis, back pain and falls.   Gastrointestinal: Negative.    Genitourinary: Negative.    Neurological: Negative.    Psychiatric/Behavioral: Negative.    Allergic/Immunologic: Negative.        Physical Exam  GENERAL: The patient is well developed, well nourished, resting comfortably and in no distress.   HEENT: No abnormalities of the visible oro-nasopharynx, conjunctiva or sclera are noted.  NECK: There is no jugular venous distension. Carotids are palpable and without bruits. There is no thyroid enlargement.  Chest: Lung fields are clear to auscultation. There are no wheezes or crackles.  CV: There is a regular rhythm. The first and second heart sounds are normal.??A soft apical systolic murmur is heard.??A soft diastolic rumble is also heard.?There are no?gallops or rubs.  ABD: The abdomen is soft and supple with normal  bowel sounds. There is no hepatosplenomegaly, ascites, tenderness, masses or bruits.  Neuro: There are no focal motor defects. Ambulation is normal. Cognitive function appears normal.  Ext:?There is trace bipedal?edema?without?evidence of deep vein thrombosis. Peripheral pulses are satisfactory. ?  SKIN:?There are no rashes and no cellulitis  PSYCH:?The patient is calm, rationale and oriented    Cardiovascular Studies    A twelve-lead ECG obtained on 03/05/2021 revealed normal sinus rhythm with a heart rate of 68 bpm.  Mild nondiagnostic ST-T wave abnormalities are seen.    An outside echo Doppler study performed on 09/17/2016 following mitral valve repair revealed 1) left ventricular chamber and wall dimensions are normal. ?2) the calculated left ventricular ejection fraction is 55%. ?3) left ventricular systolic function is normal. ?4) normal left atrial pressure and diastolic function. ?5) mild mitral valve regurgitation without mitral valve stenosis.  ?  Outside heart catheterization and coronary angiography performed on 07/14/2016 prior to mitral valve repair revealed: 1) normal coronary artery anatomy. ?2) normal left ventricular systolic function. ?3) minimal elevation of right heart pressures. ?The right atrial pressure was 15 mmHg. ?The right ventricular pressure was 43/13 mmHg. ?The right pulmonary artery pressure was 42/23 mmHg with a mean of 31 mmHg. ?The pulmonary catheter wedge pressure was 16 mmHg. ?Left ventricular end-diastolic pressure was 19 mmHg. ?The cardiac output by Fick determination was 6.8 L/min and by thermodilution 7.3 L/min.?4)?moderate mitral valve regurgitation.  ?  Echo Doppler 05/18/2019:  1. No regional wall motion abnormalities are seen. Overall LV systolic function appears normal. The estimated left ventricular ejection fraction is 60%.  2. Grade II (moderate) left ventricular diastolic dysfunction. Elevated left atrial pressure.  3. The right ventricle is not visualized well. Limited views suggest normal right ventricular size and contractility.  4. Left atrial chamber dimension appear grossly normal.  5. Prior mitral valve annuloplasty repair is noted. ?The mean mitral diastolic gradient is approximately 7 mmHg suggesting mild mitral valve stenosis following mitral valve repair. ?Trace mitral valve regurgitation is noted by Doppler exam.  6. No pericardial effusion is seen.  ?  Stress echo 05/18/2019:        ? ? ?    Baseline Information  ?    Baseline HR  69 bpm       Baseline BP - Sys  124 mmHg       Baseline BP - Dias  70 mmHg       Percent HR  90 %       Target HR  147              ? ? ?    Stress Information  ?    Peak HR  97 bpm       Peak BP - Sys  132 mmHg       Peak BP - Dias  72       Percent of predicted max HR  60 %       Exercise duration (min)  4 min       Exercise duration (sec)  3 sec       Estimated workload  7 METS          ? Technically difficult study; i.v. transpulmonary contrast was used to define the endocardial borders.  ? Normal left ventricular systolic function, estimated ejection fraction is 65 %.  ? Baseline EKG demonstrated normal sinus rhythm, no ST segment deviation noted during stress, the stress EKG is negative for ischemia.  ? Duke Treadmill  score is 4, moderate risk, estimated 1 year mortality 1.0-1.1%.  ? This is a submaximal stress echo, patient attained only 60% of the maximum predicted target heart rate for age. No evidence of wall motion abnormalities at the workload achieved.  Low risk for cardiovascular complications  ?  Regadenoson thallium stress test 02/01/2020:  Scintigraphic (planar/tomographic):???There is a very small sized mild intensity perfusion defect localized to the distal inferior inferolateral myocardial wall segment that is partially fixed partially reversible. ?Limiting the sensitivity/specificity of this finding is increased extracardiac uptake in the territory of this perfusion defect. ?There are no other perfusion defect detected. ?All myocardial segments appear viable. Polar coordinate map identifies a similar perfusion defect as described above. TID Ratio: ?1.13 ?(normal <1.36). Summed Stress Score: ?4 ??, Summed Rest Score: Regional Wall Thickening and Motion Post Stress: ??There is normal left ventricular wall motion and thickening of all myocardial segments inclusive of the perceived perfusion defect in above. Left Ventricular Ejection Fraction (post stress, in the resting state) =??76 %. Left Ventricular End Diastolic Volume: 52 mL  SUMMARY/OPINION:??This study is probably normal with no evidence of significant myocardial ischemia. ?There is demonstration of a very small sized very mild intensity perfusion defect localized to the distal/apical inferior inferolateral wall. ?Limiting the specificity and sensitivity of this finding is the presence of increased extracardiac uptake in the territory of this perfusion defect. ?Moreover, there is continued preservation of myocardial thickening and brightening raise suspicion that this is a attenuation artifact. ?However, cannot rule out limited ischemia in this territory. ?Left ventricular systolic function is normal. There are no high risk prognostic indicators present. ?The pharmacologic ECG portion of the study is negative for ischemia. In aggregate the current study is low risk in regards to predicted annual cardiovascular mortality rate.        Cardiovascular Health Factors  Vitals BP Readings from Last 3 Encounters:   03/05/21 110/72   05/08/20 (!) 132/100   01/26/20 128/86     Wt Readings from Last 3 Encounters:   03/05/21 89.4 kg (197 lb)   05/08/20 93.4 kg (205 lb 12.8 oz)   01/26/20 95.7 kg (211 lb)     BMI Readings from Last 3 Encounters:   03/05/21 34.90 kg/m?   05/08/20 36.46 kg/m?   01/26/20 37.38 kg/m?      Smoking Social History     Tobacco Use   Smoking Status Former Smoker   ? Types: Cigarettes   Smokeless Tobacco Never Used      Lipid Profile Cholesterol   Date Value Ref Range Status   08/13/2020 209 (H) <200 Final     HDL   Date Value Ref Range Status   08/13/2020 55  Final     LDL   Date Value Ref Range Status   08/13/2020 134 (H) <100 Final     Triglycerides   Date Value Ref Range Status   08/13/2020 98  Final      Blood Sugar No results found for: HGBA1C  Glucose   Date Value Ref Range Status   01/17/2021 110 (H) 70 - 105 Final   01/26/2020 100  Final   07/28/2019 123 (H) 70 - 105 Final          Problems Addressed Today  Encounter Diagnoses   Name Primary?   ? Essential hypertension Yes   ? S/P mitral valve repair        Assessment and Plan     Kristina Wong reports that she is currently  doing well and indicates that her blood pressure is well controlled, with the majority of ambulatory readings less than 130/80 mmHg.  She reports no chest discomfort or congestive symptoms. I have asked the patient to keep a log book of her BP readings and to report BP readings exceeding 130/80 mm Hg. Regular mild aerobic exercise, weight loss and adherence to a heart healthy diet were recommended. I have asked her to return for follow-up in 12 months with a repeat echo Doppler study to assess for any structural abnormalities related to her mitral valve repair.         Current Medications (including today's revisions)  ? albuterol sulfate (PROAIR HFA) 90 mcg/actuation aerosol inhaler Inhale 2 puffs by mouth into the lungs as Needed.   ? amantadine (SYMMETREL) 100 mg capsule Take 100 mg by mouth twice daily.   ? aspirin EC 81 mg tablet Take 81 mg by mouth daily. Take with food.   ? atorvastatin (LIPITOR) 20 mg tablet Take 20 mg by mouth daily.   ? bisacodyL (DULCOLAX) 5 mg tablet Take 5 mg by mouth every 24 hours as needed for Constipation.   ? cholecalciferol (VITAMIN D-3) 1,000 units tablet Take 1,000 Units by mouth daily.   ? desvenlafaxine (PRISTIQ) 50 mg tablet Take 50 mg by mouth daily.   ? furosemide (LASIX) 20 mg tablet Take 20 mg by mouth daily as needed.   ? levocetirizine 5 mg tab Take 1 tablet by mouth daily.   ? levothyroxine (SYNTHROID) 100 mcg tablet Take 100 mcg by mouth daily 30 minutes before breakfast.   ? meloxicam (MOBIC) 7.5 mg tablet Take 7.5 mg by mouth daily.   ? metoprolol XL (TOPROL XL) 100 mg extended release tablet Take 100 mg by mouth daily.   ? omeprazole DR (PRILOSEC) 40 mg capsule Take 40 mg by mouth daily before breakfast.   ? polyethylene glycol 3350 (MIRALAX) 17 g packet Take 17 g by mouth as Needed.   ? potassium chloride SR (K-DUR) 10 mEq tablet Take 10 mEq by mouth as Needed. Take with a meal and a full glass of water.   ? tolterodine LA (DETROL LA) 4 mg capsule Take 4 mg by mouth daily.   ? valsartan (DIOVAN) 80 mg tablet Take 80 mg by mouth daily.

## 2022-02-10 ENCOUNTER — Ambulatory Visit: Admit: 2022-02-10 | Discharge: 2022-02-10

## 2022-02-10 ENCOUNTER — Encounter: Admit: 2022-02-10 | Discharge: 2022-02-10

## 2022-02-10 DIAGNOSIS — I10 Essential (primary) hypertension: Secondary | ICD-10-CM

## 2022-02-10 DIAGNOSIS — Z9889 Other specified postprocedural states: Secondary | ICD-10-CM

## 2022-02-10 MED ORDER — PERFLUTREN LIPID MICROSPHERES 1.1 MG/ML IV SUSP
1-10 mL | Freq: Once | INTRAVENOUS | 0 refills | Status: CP | PRN
Start: 2022-02-10 — End: ?

## 2022-02-11 ENCOUNTER — Encounter: Admit: 2022-02-11 | Discharge: 2022-02-11

## 2022-02-11 NOTE — Telephone Encounter
Results and recommendations called to patient. Patient has no further questions at this time.

## 2022-02-11 NOTE — Telephone Encounter
-----   Message from Hester Mates, MD sent at 02/11/2022  1:02 PM CDT -----  Favorable echo Doppler study.  Please let her know.  Thanks.  SBG  ----- Message -----  From: Levora Angel, MD  Sent: 02/11/2022  11:54 AM CDT  To: Hester Mates, MD

## 2022-03-27 IMAGING — NM NM HIDA
1 series · 6 of 6 positions shown · non-contrast
Comparison: none

[flow · 4.52mm/px · 6 of 60 frames shown]
[frame 6/60]
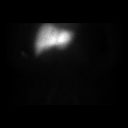
[frame 16/60]
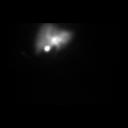
[frame 26/60]
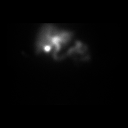
[frame 36/60]
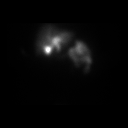
[frame 46/60]
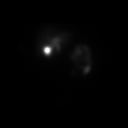
[frame 56/60]
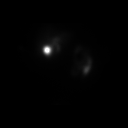

[6 of 6 positions shown; findings below may reference images not displayed]

EXAM

Nuclear medicine hepatobiliary scan

INDICATION

RUQ abd pain
5.3 mCi YcUUm Mebrofenin @ 9380. 1.9 micrograms Kinevac infused and imaged 60 minutes post
infusion. Pt c/o nausea and vomiting that worsens after eating x several years. ME

FINDINGS

The patient received an IV dose of 5.3 millicuries of technetium 99 M mebrofenin. Abdominal imaging
was performed up to 60 minutes. The patient received an IV dose of 1.9 micrograms of Kinevac and
gallbladder ejection fraction evaluation was performed.

There is homogeneous activity throughout the liver. There is appearance of tracer activity in the
gallbladder within 15 minutes. There is normal progression of activity into the small bowel.

The gallbladder ejection fraction is measured at 30 percent.

IMPRESSION

There is normal appearance of tracer activity in the gallbladder with no evidence of cystic duct or
common bile duct obstruction. There are the as an abnormally low gallbladder ejection fraction of 30
percent.

Tech Notes:

## 2022-05-07 ENCOUNTER — Encounter: Admit: 2022-05-07 | Discharge: 2022-05-07

## 2022-05-13 ENCOUNTER — Encounter: Admit: 2022-05-13 | Discharge: 2022-05-13

## 2022-05-13 DIAGNOSIS — E785 Hyperlipidemia, unspecified: Secondary | ICD-10-CM

## 2022-05-13 DIAGNOSIS — Z9889 Other specified postprocedural states: Secondary | ICD-10-CM

## 2022-05-13 DIAGNOSIS — F32A Depression: Secondary | ICD-10-CM

## 2022-05-13 DIAGNOSIS — I10 Essential (primary) hypertension: Secondary | ICD-10-CM

## 2022-05-13 DIAGNOSIS — G35 Multiple sclerosis: Secondary | ICD-10-CM

## 2022-05-13 DIAGNOSIS — E0500 Thyrotoxicosis with diffuse goiter without thyrotoxic crisis or storm: Secondary | ICD-10-CM

## 2022-05-13 DIAGNOSIS — R079 Chest pain, unspecified: Secondary | ICD-10-CM

## 2022-05-13 DIAGNOSIS — Z136 Encounter for screening for cardiovascular disorders: Secondary | ICD-10-CM

## 2022-05-13 MED ORDER — ATORVASTATIN 40 MG PO TAB
20 mg | ORAL_TABLET | Freq: Every day | ORAL | 3 refills | Status: DC
Start: 2022-05-13 — End: 2022-05-13

## 2022-05-13 MED ORDER — ATORVASTATIN 40 MG PO TAB
40 mg | ORAL_TABLET | Freq: Every day | ORAL | 3 refills | Status: AC
Start: 2022-05-13 — End: ?

## 2022-05-13 NOTE — Progress Notes
Date of Service: 05/13/2022    Kristina Wong is a 61 y.o. female.       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   She works the night shift from 6 PM to 6 AM. ? I noticed that she was seen in the emergency room on 03/20/2022 for chest discomfort, dizziness and facial numbness.  There was no evidence for an acute coronary syndrome and she had no recurrence of chest discomfort. Kristina Wong attributed her symptoms on 03/20/2022 to exhaustion.  She reports the majority of her blood pressure readings are less than 130/80 mmHg. ?Otherwise, the patient has been doing well and reports no angina, congestive symptoms, palpitations, sensation of sustained forceful heart pounding, lightheadedness or syncope.??Her exercise tolerance has been stable, although she has not been exercising on a regular basis.  The patient reports no myalgias, bleeding abnormalities, claudication or strokelike symptoms.  She is followed for multiple sclerosis and retinitis pigmentosa.  Historically,?Kristina Wong?previously underwent mitral valve repair on August 04, 2016. ?This involved use of a 27 mm Medtronic Durand Ancore annuloplasty ring with a mini sternotomy.?Kristina Wong contracted COVID-19 in June 2020. ?She later tested negative in July 2020.??She has since received her Covid vaccines. When I saw her on January 26, 2020 she reported nondiagnostic chest discomfort. ?A stress test was obtained without significant abnormality.She reports that she underwent laparoscopic cholecystectomy in March 2022 without complications.          Vitals:    05/13/22 1016   BP: 118/72   BP Source: Arm, Left Upper   Pulse: 68   SpO2: 98%   O2 Device: None (Room air)   PainSc: Zero   Weight: 93.9 kg (207 lb)   Height: 160 cm (5' 3)     Body mass index is 36.67 kg/m?Marland Kitchen     Past Medical History  Patient Active Problem List    Diagnosis Date Noted   ? Hypothyroid 04/04/2019   ? Hyperlipemia 04/04/2019   ? Essential hypertension 04/04/2019   ? Generalized anxiety disorder 04/04/2019   ? Chest pain 04/04/2019     07/14/16 Cardiac Cath:  Normal coronary artery anatomy.  Normal left ventricular systolic function.  Minimal elevation of right heart pressures.  By ventriculography, moderate mitral valve regurgitation       ? Graves disease 04/04/2019   ? Depression 04/04/2019   ? Multiple sclerosis (HCC) 04/04/2019   ? 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 Sherral Hammers           Review of Systems   Constitutional: Negative.   HENT: Negative.    Eyes: Negative.    Cardiovascular: Positive for dyspnea on exertion.   Endocrine: Negative.    Hematologic/Lymphatic: Negative.    Skin: Negative.    Musculoskeletal: Negative.    Gastrointestinal: Negative.    Genitourinary: Negative.    Neurological: Negative.    Psychiatric/Behavioral: Negative.    Allergic/Immunologic: Negative.        Physical Exam  GENERAL: The patient is well developed, well nourished, resting comfortably and in no distress.   HEENT: No abnormalities of the visible oro-nasopharynx, conjunctiva or sclera are noted.  NECK: There is no jugular venous distension. Carotids are palpable and without bruits. There is no thyroid enlargement.  Chest: Lung fields are clear to auscultation. There  are no wheezes or crackles.  CV: There is a regular rhythm. The first and second heart sounds are normal.??A soft apical systolic murmur is heard.??A soft diastolic rumble is also heard.?There are no?gallops or rubs.  ABD: The abdomen is soft and supple with normal bowel sounds. There is no hepatosplenomegaly, ascites, tenderness, masses or bruits.  Neuro: There are no focal motor defects. Ambulation is normal. Cognitive function appears normal.  Ext:?There is trace bipedal?edema?without?evidence of deep vein thrombosis. Peripheral pulses are satisfactory. ?  SKIN:?There are no rashes and no cellulitis  PSYCH:?The patient is calm, rationale and oriented    Cardiovascular Studies  A twelve-lead ECG obtained on 05/13/2022 reveals normal sinus rhythm with a heart rate of 68 bpm.  There is no evidence of myocardial ischemia or infarction.    Echo Doppler 02/10/2022:  Interpretation Summary    ?  ? The left ventricular size is normal. Mild predominantly basal septal hypertrophy. The ejection fraction by Simpson's biplane method is 57%. There are no segmental wall motion abnormalities. Normal septal motion. Cannot determine left ventricular diastolic function.  ? The right ventricular size is normal. The right ventricular wall thickness is normal. PASP is estimated at 24 mmHg.  ? Biatrial size and appearance is normal.  ? Mitral Valve: Repaired mitral valve with annuloplasty ring. Mild stenosis. Trace regurgitation. Mitral valve mean gradient of 5 mmHg.  ? The aortic root and ascending aorta are normal in size.  ? No pericardial effusion.     Compared to study 05/18/2019, there is no significant change.  ?  Echocardiographic Findings    Left Ventricle The left ventricular size is normal. Mild predominantly basal septal hypertrophy. The ejection fraction by Simpson's biplane method is 57%. There are no segmental wall motion abnormalities. Normal septal motion. Cannot determine left ventricular diastolic function.      Right Ventricle The right ventricular size is normal. The right ventricular wall thickness is normal. PASP is estimated at 24 mmHg.      Left Atrium Normal size.      Right Atrium Normal size.      IVC/SVC Normal central venous pressure (0-5 mm Hg).      Mitral Valve Repaired mitral valve with annuloplasty ring. Mild stenosis. Trace regurgitation.      Tricuspid Valve Normal valve structure. No stenosis. Trace regurgitation.      Aortic Valve Tricuspid aortic valve present.  No stenosis. No regurgitation.      Pulmonary Normal valve structure. No stenosis. Trace regurgitation.      Aorta The aortic root and ascending aorta are normal in size.      Pericardium No pericardial effusion.        Regadenoson thallium stress test 02/01/2020:  Scintigraphic (planar/tomographic):???There is a very small sized mild intensity perfusion defect localized to the distal inferior inferolateral myocardial wall segment that is partially fixed partially reversible. ?Limiting the sensitivity/specificity of this finding is increased extracardiac uptake in the territory of this perfusion defect. ?There are no other perfusion defect detected. ?All myocardial segments appear viable. Polar coordinate map identifies a similar perfusion defect as described above. TID Ratio: ?1.13 ?(normal <1.36). Summed Stress Score: ?4 ??, Summed Rest Score: Regional Wall Thickening and Motion Post Stress: ??There is normal left ventricular wall motion and thickening of all myocardial segments inclusive of the perceived perfusion defect in above. Left Ventricular Ejection Fraction (post stress, in the resting state) =??76 %. Left Ventricular End Diastolic Volume: 52 mL  SUMMARY/OPINION:??This study is probably normal with  no evidence of significant myocardial ischemia. ?There is demonstration of a very small sized very mild intensity perfusion defect localized to the distal/apical inferior inferolateral wall. ?Limiting the specificity and sensitivity of this finding is the presence of increased extracardiac uptake in the territory of this perfusion defect. ?Moreover, there is continued preservation of myocardial thickening and brightening raise suspicion that this is a attenuation artifact. ?However, cannot rule out limited ischemia in this territory. ?Left ventricular systolic function is normal. There are no high risk prognostic indicators present. ?The pharmacologic ECG portion of the study is negative for ischemia. In aggregate the current study is low risk in regards to predicted annual cardiovascular mortality rate.    Cardiovascular Health Factors  Vitals BP Readings from Last 3 Encounters:   05/13/22 118/72   02/10/22 (!) 149/81   03/05/21 110/72     Wt Readings from Last 3 Encounters:   05/13/22 93.9 kg (207 lb)   02/10/22 91.2 kg (201 lb)   03/05/21 89.4 kg (197 lb)     BMI Readings from Last 3 Encounters:   05/13/22 36.67 kg/m?   02/10/22 35.61 kg/m?   03/05/21 34.90 kg/m?      Smoking Social History     Tobacco Use   Smoking Status Former   ? Types: Cigarettes   Smokeless Tobacco Never      Lipid Profile Cholesterol   Date Value Ref Range Status   01/16/2022 151  Final     HDL   Date Value Ref Range Status   01/16/2022 49  Final     LDL   Date Value Ref Range Status   01/16/2022 80  Final     Triglycerides   Date Value Ref Range Status   01/16/2022 114  Final      Blood Sugar No results found for: HGBA1C  Glucose   Date Value Ref Range Status   03/20/2022 106 (H) 70 - 105 Final   01/17/2021 110 (H) 70 - 105 Final   01/26/2020 100  Final          Problems Addressed Today  Encounter Diagnoses   Name Primary?   ? Screening for heart disease Yes   ? Essential hypertension    ? S/P mitral valve repair    ? Hyperlipidemia, unspecified hyperlipidemia type    ? Chest pain, unspecified type        Assessment and Plan    Kristina Wong?reports that she is currently stable from a cardiovascular perspective and indicates that her blood pressure is well controlled, with the majority of ambulatory readings less than 130/80 mmHg. I have asked the patient to keep a log book of her?BP readings and to report BP readings exceeding 130/80 mm Hg. She reports no current chest discomfort or congestive symptoms.  Her mean mitral valve gradient was actually less on her recent echo Doppler study obtained in April 2023 compared to 2020 (5 mmHg versus 7 mmHg).  Only trace mitral valve regurgitation was reported.  Kristina Wong suspects that she has sleep apnea and requested a referral to sleep medicine for sleep study.  Alternatives for the management of hypercholesterolemia were reviewed with the patient and she wanted to increase her dose of atorvastatin to 40 mg daily.  Possible adverse effects associated with increasing her dose of statin were reviewed with the patient and she was asked to report any myalgias or other worrisome symptoms.  I have asked her to repeat her fasting lipid profile ALT in 3 months  time.  Kristina Wong suspects that her chronic dyspnea with exertion may be related to her weight.  I see no cardiovascular reasons why she could not take a GLP-1 agonist.  Regular?mild?aerobic exercise, weight loss and adherence to a heart healthy diet were recommended.?I have asked her to return for follow-up in?6?months to follow her progress. The total time spent during this interview and exam with preparation and chart review was 30 minutes.         Current Medications (including today's revisions)  ? albuterol sulfate (PROAIR HFA) 90 mcg/actuation aerosol inhaler Inhale two puffs by mouth into the lungs as Needed.   ? amantadine (SYMMETREL) 100 mg capsule Take one capsule by mouth twice daily.   ? aspirin EC 81 mg tablet Take one tablet by mouth daily. Take with food.   ? atorvastatin (LIPITOR) 40 mg tablet Take one tablet by mouth daily.   ? bisacodyL (DULCOLAX) 5 mg tablet Take one tablet by mouth every 24 hours as needed for Constipation.   ? cholecalciferol (VITAMIN D-3) 1,000 units tablet Take one tablet by mouth daily.   ? duloxetine DR (CYMBALTA) 60 mg capsule Take one capsule by mouth daily.   ? furosemide (LASIX) 20 mg tablet Take one tablet by mouth daily as needed.   ? levocetirizine 5 mg tab Take one tablet by mouth daily.   ? levothyroxine (SYNTHROID) 112 mcg tablet Take one tablet by mouth daily 30 minutes before breakfast.   ? meloxicam (MOBIC) 7.5 mg tablet Take one tablet by mouth daily.   ? metoprolol XL (TOPROL XL) 100 mg extended release tablet Take one tablet by mouth daily.   ? omeprazole DR (PRILOSEC) 40 mg capsule Take one capsule by mouth daily before breakfast.   ? oxybutynin XL (DITROPAN XL) 10 mg tablet Take one tablet by mouth daily.   ? polyethylene glycol 3350 (MIRALAX) 17 g packet Take one packet by mouth as Needed.   ? potassium chloride SR (K-DUR) 10 mEq tablet Take one tablet by mouth as Needed. Take with a meal and a full glass of water.   ? valsartan (DIOVAN) 80 mg tablet Take one tablet by mouth daily.

## 2022-05-13 NOTE — Patient Instructions
Thank you for visiting our office today.    We would like to make the following medication adjustments:      Increase Lipitor 40mg  daily        Otherwise continue the same medications as you have been doing.          We will be pursuing the following tests after your appointment today:       Orders Placed This Encounter    LIPID PROFILE    ALT (SGPT)    AMB REFERRAL TO PULMONARY    ECG 12-LEAD    atorvastatin (LIPITOR) 40 mg tablet     Recheck fasting labs in 3 months    We will plan to see you back in 6 months.  Please call in the meantime with any questions or concerns.        Please allow 5-7 business days for our providers to review your results. All normal results will go to MyChart. If you do not have Mychart, it is strongly recommended to get this so you can easily view all your results. If you do not have mychart, we will attempt to call you once with normal lab and testing results. If we cannot reach you by phone with normal results, we will send you a letter.  If you have not heard the results of your testing after one week please give [place] a call.       Your [facility] [place] Team Gabriel Rung, Brett Canales, Pilar Jarvis, and [place])  phone number is [phone].

## 2022-05-28 ENCOUNTER — Encounter: Admit: 2022-05-28 | Discharge: 2022-05-28

## 2022-10-23 ENCOUNTER — Encounter: Admit: 2022-10-23 | Discharge: 2022-10-23

## 2022-11-11 ENCOUNTER — Encounter: Admit: 2022-11-11 | Discharge: 2022-11-11

## 2022-11-11 ENCOUNTER — Ambulatory Visit: Admit: 2022-11-11 | Discharge: 2022-11-12

## 2022-11-11 DIAGNOSIS — E0500 Thyrotoxicosis with diffuse goiter without thyrotoxic crisis or storm: Secondary | ICD-10-CM

## 2022-11-11 DIAGNOSIS — E785 Hyperlipidemia, unspecified: Secondary | ICD-10-CM

## 2022-11-11 DIAGNOSIS — G35 Multiple sclerosis: Secondary | ICD-10-CM

## 2022-11-11 DIAGNOSIS — F32A Depression: Secondary | ICD-10-CM

## 2022-11-11 DIAGNOSIS — Z9889 Other specified postprocedural states: Secondary | ICD-10-CM

## 2022-11-11 DIAGNOSIS — I10 Essential (primary) hypertension: Secondary | ICD-10-CM

## 2022-11-11 NOTE — Progress Notes
Date of Service: 11/11/2022    Kristina Wong is a 62 y.o. female.       HPI   Kristina Wong has been followed for mitral valve disease.  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   She works the night shift from 6 PM to 6 AM.  Her blood pressure was elevated in clinic today but she is sure that her blood pressure is well-controlled when she checks it outside the office.  Otherwise, the patient has been doing well and reports no angina, congestive symptoms, palpitations, sensation of sustained forceful heart pounding, lightheadedness or syncope.  Her exercise tolerance has improved. She purchased a treadmill and walks on the treadmill 4-5 times per week for 15 minutes.  The patient reports no myalgias, bleeding abnormalities, claudication or strokelike symptoms.  She is followed for multiple sclerosis and retinitis pigmentosa.  She reports contracting COVID again in December 2023 but was not severely ill.  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  This involved use of a 27 mm Medtronic Durand Ancore annuloplasty ring with a mini sternotomy. Kristina Wong contracted COVID-19 in June 2020.  She later tested negative in July 2020.  She has since received her Covid vaccines. When I saw her on January 26, 2020 she reported nondiagnostic chest discomfort.  A stress test was obtained without significant abnormality.She reports that she underwent laparoscopic cholecystectomy in March 2022 without complications.            Vitals:    11/11/22 1059   BP: (!) 140/90   BP Source: Arm, Left Upper   Pulse: 65   SpO2: 97%   O2 Device: None (Room air)   PainSc: Zero   Weight: 92.8 kg (204 lb 9.6 oz)   Height: 160 cm (5' 3)     Body mass index is 36.24 kg/m?Marland Kitchen     Past Medical History  Patient Active Problem List    Diagnosis Date Noted    Hypothyroid 04/04/2019    Hyperlipemia 04/04/2019    Essential hypertension 04/04/2019    Generalized anxiety disorder 04/04/2019    Chest pain 04/04/2019     07/14/16 Cardiac Cath:  Normal coronary artery anatomy.  Normal left ventricular systolic function.  Minimal elevation of right heart pressures.  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 Sherral Hammers           Review of Systems   Constitutional: Negative.   HENT: Negative.     Eyes: Negative.    Cardiovascular: Negative.    Respiratory: Negative.     Endocrine: Negative.    Hematologic/Lymphatic: Negative.    Skin: Negative.    Musculoskeletal: Negative.    Gastrointestinal: Negative.    Genitourinary: Negative.    Neurological: Negative.    Psychiatric/Behavioral: Negative.     Allergic/Immunologic: Negative.      Physical Exam  GENERAL: The patient is well developed, well nourished, resting comfortably and in no distress.   HEENT: No abnormalities of the visible oro-nasopharynx, conjunctiva or sclera are noted.  NECK: There is no jugular venous distension. Carotids are palpable and without bruits. There is no thyroid enlargement.  Chest: Lung fields are  clear to auscultation. There are no wheezes or crackles.  CV: There is a regular rhythm. The first and second heart sounds are normal.  A soft apical systolic murmur is heard.  A soft diastolic rumble is also heard. There are no gallops or rubs.  ABD: The abdomen is soft and supple with normal bowel sounds. There is no hepatosplenomegaly, ascites, tenderness, masses or bruits.  Neuro: There are no focal motor defects. Ambulation is normal. Cognitive function appears normal.  Ext: There is trace bipedal edema without evidence of deep vein thrombosis. Peripheral pulses are satisfactory.    SKIN: There are no rashes and no cellulitis  PSYCH: The patient is calm, rationale and oriented    Cardiovascular Studies  A twelve-lead ECG obtained on 05/13/2022 reveals normal sinus rhythm with a heart rate of 68 bpm.  There is no evidence of myocardial ischemia or infarction.     Echo Doppler 02/10/2022:  Interpretation Summary        The left ventricular size is normal. Mild predominantly basal septal hypertrophy. The ejection fraction by Simpson's biplane method is 57%. There are no segmental wall motion abnormalities. Normal septal motion. Cannot determine left ventricular diastolic function.  The right ventricular size is normal. The right ventricular wall thickness is normal. PASP is estimated at 24 mmHg.  Biatrial size and appearance is normal.  Mitral Valve: Repaired mitral valve with annuloplasty ring. Mild stenosis. Trace regurgitation. Mitral valve mean gradient of 5 mmHg.  The aortic root and ascending aorta are normal in size.  No pericardial effusion.     Compared to study 05/18/2019, there is no significant change.     Echocardiographic Findings     Left Ventricle The left ventricular size is normal. Mild predominantly basal septal hypertrophy. The ejection fraction by Simpson's biplane method is 57%. There are no segmental wall motion abnormalities. Normal septal motion. Cannot determine left ventricular diastolic function.      Right Ventricle The right ventricular size is normal. The right ventricular wall thickness is normal. PASP is estimated at 24 mmHg.      Left Atrium Normal size.      Right Atrium Normal size.      IVC/SVC Normal central venous pressure (0-5 mm Hg).      Mitral Valve Repaired mitral valve with annuloplasty ring. Mild stenosis. Trace regurgitation.      Tricuspid Valve Normal valve structure. No stenosis. Trace regurgitation.      Aortic Valve Tricuspid aortic valve present.  No stenosis. No regurgitation.      Pulmonary Normal valve structure. No stenosis. Trace regurgitation.      Aorta The aortic root and ascending aorta are normal in size.      Pericardium No pericardial effusion.         Regadenoson thallium stress test 02/01/2020:  Scintigraphic (planar/tomographic):   There is a very small sized mild intensity perfusion defect localized to the distal inferior inferolateral myocardial wall segment that is partially fixed partially reversible.  Limiting the sensitivity/specificity of this finding is increased extracardiac uptake in the territory of this perfusion defect.  There are no other perfusion defect detected.  All myocardial segments appear viable. Polar coordinate map identifies a similar perfusion defect as described above. TID Ratio:  1.13  (normal <1.36). Summed Stress Score:  4   , Summed Rest Score: Regional Wall Thickening and Motion Post Stress:   There is normal left ventricular wall motion and thickening of all myocardial segments inclusive of the perceived  perfusion defect in above. Left Ventricular Ejection Fraction (post stress, in the resting state) =  76 %. Left Ventricular End Diastolic Volume: 52 mL  SUMMARY/OPINION:  This study is probably normal with no evidence of significant myocardial ischemia.  There is demonstration of a very small sized very mild intensity perfusion defect localized to the distal/apical inferior inferolateral wall.  Limiting the specificity and sensitivity of this finding is the presence of increased extracardiac uptake in the territory of this perfusion defect.  Moreover, there is continued preservation of myocardial thickening and brightening raise suspicion that this is a attenuation artifact.  However, cannot rule out limited ischemia in this territory.  Left ventricular systolic function is normal. There are no high risk prognostic indicators present.  The pharmacologic ECG portion of the study is negative for ischemia. In aggregate the current study is low risk in regards to predicted annual cardiovascular mortality rate.    Cardiovascular Health Factors  Vitals BP Readings from Last 3 Encounters:   11/11/22 (!) 140/90   05/13/22 118/72   02/10/22 (!) 149/81     Wt Readings from Last 3 Encounters:   11/11/22 92.8 kg (204 lb 9.6 oz)   05/13/22 93.9 kg (207 lb)   02/10/22 91.2 kg (201 lb)     BMI Readings from Last 3 Encounters:   11/11/22 36.24 kg/m?   05/13/22 36.67 kg/m?   02/10/22 35.61 kg/m?      Smoking Social History     Tobacco Use   Smoking Status Former    Types: Cigarettes   Smokeless Tobacco Never      Lipid Profile Cholesterol   Date Value Ref Range Status   01/16/2022 151  Final     HDL   Date Value Ref Range Status   01/16/2022 49  Final     LDL   Date Value Ref Range Status   01/16/2022 80  Final     Triglycerides   Date Value Ref Range Status   01/16/2022 114  Final      Blood Sugar No results found for: HGBA1C  Glucose   Date Value Ref Range Status   03/20/2022 106 (H) 70 - 105 Final   01/17/2021 110 (H) 70 - 105 Final   01/26/2020 100  Final          Problems Addressed Today  Mitral valve regurgitation.  Hypertension.  Hypercholesterolemia.    Assessment and Plan     Ms. Devito reports that she is currently stable from a cardiovascular perspective and indicates that her blood pressure is well controlled, with the majority of ambulatory readings less than 130/80 mmHg. I have asked the patient to keep a log book of her BP readings and to report BP readings exceeding 130/80 mm Hg. She reports no current chest discomfort or congestive symptoms.  Her mean mitral valve gradient was actually less on her recent echo Doppler study obtained in April 2023 compared to 2020 (5 mmHg versus 7 mmHg).  Only trace mitral valve regurgitation was reported.  I congratulated her on purchasing and using a treadmill.  Regular mild aerobic exercise, weight loss and adherence to a heart healthy diet were recommended. I have asked her to return for follow-up in 6 months to follow her progress. The total time spent during this interview and exam with preparation and chart review was 30 minutes.          Current Medications (including today's revisions)   albuterol sulfate (PROAIR HFA) 90 mcg/actuation aerosol  inhaler Inhale two puffs by mouth into the lungs as Needed.    amantadine (SYMMETREL) 100 mg capsule Take one capsule by mouth twice daily.    aspirin EC 81 mg tablet Take one tablet by mouth daily. Take with food.    atorvastatin (LIPITOR) 40 mg tablet Take one tablet by mouth daily.    bisacodyL (DULCOLAX) 5 mg tablet Take one tablet by mouth every 24 hours as needed for Constipation.    cholecalciferol (VITAMIN D-3) 1,000 units tablet Take one tablet by mouth daily.    duloxetine DR (CYMBALTA) 60 mg capsule Take one capsule by mouth daily.    furosemide (LASIX) 20 mg tablet Take one tablet by mouth daily as needed.    levocetirizine 5 mg tab Take one tablet by mouth daily.    levothyroxine (SYNTHROID) 112 mcg tablet Take one tablet by mouth daily 30 minutes before breakfast.    meloxicam (MOBIC) 7.5 mg tablet Take one tablet by mouth daily.    metoprolol XL (TOPROL XL) 100 mg extended release tablet Take one tablet by mouth daily.    omeprazole DR (PRILOSEC) 40 mg capsule Take one capsule by mouth daily before breakfast.    oxybutynin XL (DITROPAN XL) 10 mg tablet Take one tablet by mouth daily.    polyethylene glycol 3350 (MIRALAX) 17 g packet Take one packet by mouth as Needed.    potassium chloride SR (K-DUR) 10 mEq tablet Take one tablet by mouth as Needed. Take with a meal and a full glass of water.    valsartan (DIOVAN) 80 mg tablet Take one tablet by mouth daily.

## 2024-01-19 ENCOUNTER — Encounter: Admit: 2024-01-19 | Discharge: 2024-01-19 | Payer: TRICARE (CHAMPUS)

## 2024-02-22 ENCOUNTER — Encounter: Admit: 2024-02-22 | Discharge: 2024-02-22 | Payer: TRICARE (CHAMPUS)

## 2024-03-01 ENCOUNTER — Encounter: Admit: 2024-03-01 | Discharge: 2024-03-01 | Payer: TRICARE (CHAMPUS)

## 2024-03-01 DIAGNOSIS — I10 Essential (primary) hypertension: Secondary | ICD-10-CM

## 2024-03-01 DIAGNOSIS — R079 Chest pain, unspecified: Secondary | ICD-10-CM

## 2024-03-01 DIAGNOSIS — Z136 Encounter for screening for cardiovascular disorders: Secondary | ICD-10-CM

## 2024-03-01 DIAGNOSIS — E785 Hyperlipidemia, unspecified: Secondary | ICD-10-CM

## 2024-03-01 DIAGNOSIS — Z9889 Other specified postprocedural states: Secondary | ICD-10-CM

## 2024-03-01 NOTE — Progress Notes
 Date of Service: 03/01/2024    Kristina Wong is a 63 y.o. female.       HPI   Ms. Luedke has been followed for mitral valve disease.  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   She works the night shift from 6 PM to 6 AM.  I have not seen her since January 2024.  Since that time she has lost 50 pounds with the use of semaglutide. Ms. Cabezas reports gradual mild increasing fatigue and tiredness over the past year.  Occasionally she will notice lightheadedness.  This reminds her of symptoms she had before her diagnosis of multiple sclerosis.  She reports that she is scheduling to see her neurologist.  Otherwise, the patient has been doing well and reports no angina, congestive symptoms, palpitations, sensation of sustained forceful heart pounding, presyncope or syncope.  Her exercise tolerance has been stable.  She continues to use her treadmill for exercise walking 30 minutes 4-5 times per week..  The patient reports no myalgias, bleeding abnormalities, claudication or strokelike symptoms.  She is followed for multiple sclerosis and retinitis pigmentosa.   Historically, Ms. Biener previously underwent mitral valve repair on August 04, 2016.  This involved use of a 27 mm Medtronic Durand Ancore annuloplasty ring with a mini sternotomy. Ms. Blaize contracted COVID-19 in June 2020. She has since received her Covid vaccines. When I saw her on January 26, 2020 she reported nondiagnostic chest discomfort.  A stress test was obtained without significant abnormality.She reports that she underwent laparoscopic cholecystectomy in March 2022 without complications.  She reports contracting COVID again in December 2023 but was not severely ill.        Vitals:    03/01/24 1521   BP: (!) 134/95   BP Source: Arm, Left Upper   Pulse: 78   SpO2: 98%   PainSc: Zero   Weight: 71 kg (156 lb 9.6 oz)   Height: 160 cm (5' 3)     Body mass index is 27.74 kg/m?Aaron Aas     Past Medical History  Patient Active Problem List Diagnosis Date Noted    Hypothyroid 04/04/2019    Hyperlipemia 04/04/2019    Essential hypertension 04/04/2019    Generalized anxiety disorder 04/04/2019    Chest pain 04/04/2019     07/14/16 Cardiac Cath:  Normal coronary artery anatomy.  Normal left ventricular systolic function.  Minimal elevation of right heart pressures.  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 Christia Cowboy           Review of Systems   Constitutional: Negative.   HENT: Negative.     Eyes:  Positive for blurred vision and double vision.   Cardiovascular:  Positive for dyspnea on exertion.   Respiratory: Negative.     Endocrine: Negative.    Hematologic/Lymphatic: Negative.    Skin: Negative.    Musculoskeletal: Negative.    Gastrointestinal: Negative.    Genitourinary: Negative.    Neurological:  Positive for dizziness, headaches and light-headedness.   Psychiatric/Behavioral: Negative.     Allergic/Immunologic: Negative.        Physical Exam  GENERAL: The patient is well developed, well nourished, resting comfortably and in no distress.  HEENT: No abnormalities of the visible oro-nasopharynx, conjunctiva or sclera are noted.  NECK: There is no jugular venous distension. Carotids are palpable and without bruits. There is no thyroid enlargement.  Chest: Lung fields are clear to auscultation. There are no wheezes or crackles.  CV: There is a regular rhythm. The first and second heart sounds are normal.  A soft apical systolic murmur is heard.  A soft diastolic rumble is also heard. There are no gallops or rubs.  ABD: The abdomen is soft and supple with normal bowel sounds. There is no hepatosplenomegaly, ascites, tenderness, masses or bruits.  Neuro: There are no focal motor defects. Ambulation is normal. Cognitive function appears normal.  Ext: There is no edema or evidence of deep vein thrombosis. Peripheral pulses are satisfactory.    SKIN: There are no rashes and no cellulitis  PSYCH: The patient is calm, rationale and oriented    Cardiovascular Studies     Expand All Collapse All    Date of Service: 11/11/2022     Nubian Gaul is a 63 y.o. female.        HPI   Ms. Micco has been followed for mitral valve disease.  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   She works the night shift from 6 PM to 6 AM.  Her blood pressure was elevated in clinic today but she is sure that her blood pressure is well-controlled when she checks it outside the office.  Otherwise, the patient has been doing well and reports no angina, congestive symptoms, palpitations, sensation of sustained forceful heart pounding, lightheadedness or syncope.  Her exercise tolerance has improved. She purchased a treadmill and walks on the treadmill 4-5 times per week for 15 minutes.  The patient reports no myalgias, bleeding abnormalities, claudication or strokelike symptoms.  She is followed for multiple sclerosis and retinitis pigmentosa.  She reports contracting COVID again in December 2023 but was not severely ill.  Historically, Ms. Jeune previously underwent mitral valve repair on August 04, 2016.  This involved use of a 27 mm Medtronic Durand Ancore annuloplasty ring with a mini sternotomy. Ms. Marra contracted COVID-19 in June 2020.  She later tested negative in July 2020.  She has since received her Covid vaccines. When I saw her on January 26, 2020 she reported nondiagnostic chest discomfort.  A stress test was obtained without significant abnormality.She reports that she underwent laparoscopic cholecystectomy in March 2022 without complications.                Vitals:     11/11/22 1059   BP: (!) 140/90   BP Source: Arm, Left Upper   Pulse: 65   SpO2: 97%   O2 Device: None (Room air)   PainSc: Zero Weight: 92.8 kg (204 lb 9.6 oz)   Height: 160 cm (5' 3)      Body mass index is 36.24 kg/m?Aaron Aas      Past Medical History        Patient Active Problem List     Diagnosis Date Noted    Hypothyroid 04/04/2019    Hyperlipemia 04/04/2019    Essential hypertension 04/04/2019    Generalized anxiety disorder 04/04/2019    Chest pain 04/04/2019       07/14/16 Cardiac Cath:  Normal coronary artery anatomy.  Normal left ventricular systolic function.  Minimal elevation of right heart pressures.  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 Christia Cowboy               Review of Systems   Constitutional: Negative.   HENT: Negative.     Eyes: Negative.    Cardiovascular: Negative.    Respiratory: Negative.     Endocrine: Negative.    Hematologic/Lymphatic: Negative.    Skin: Negative.    Musculoskeletal: Negative.    Gastrointestinal: Negative.    Genitourinary: Negative.    Neurological: Negative.    Psychiatric/Behavioral: Negative.     Allergic/Immunologic: Negative.       Physical Exam  GENERAL: The patient is well developed, well nourished, resting comfortably and in no distress.   HEENT: No abnormalities of the visible oro-nasopharynx, conjunctiva or sclera are noted.  NECK: There is no jugular venous distension. Carotids are palpable and without bruits. There is no thyroid enlargement.  Chest: Lung fields are clear to auscultation. There are no wheezes or crackles.  CV: There is a regular rhythm. The first and second heart sounds are normal.  A soft apical systolic murmur is heard.  A soft diastolic rumble is also heard. There are no gallops or rubs.  ABD: The abdomen is soft and supple with normal bowel sounds. There is no hepatosplenomegaly, ascites, tenderness, masses or bruits.  Neuro: There are no focal motor defects. Ambulation is normal. Cognitive function appears normal.  Ext: There is trace bipedal edema without evidence of deep vein thrombosis. Peripheral pulses are satisfactory.    SKIN: There are no rashes and no cellulitis  PSYCH: The patient is calm, rationale and oriented     Cardiovascular Studies  A twelve-lead ECG obtained on 03/01/2024 reveals normal sinus rhythm with a heart rate of 77 bpm.  There is no evidence of myocardial ischemia or infarction.    Echo Doppler 02/10/2022:  Interpretation Summary        The left ventricular size is normal. Mild predominantly basal septal hypertrophy. The ejection fraction by Simpson's biplane method is 57%. There are no segmental wall motion abnormalities. Normal septal motion. Cannot determine left ventricular diastolic function.  The right ventricular size is normal. The right ventricular wall thickness is normal. PASP is estimated at 24 mmHg.  Biatrial size and appearance is normal.  Mitral Valve: Repaired mitral valve with annuloplasty ring. Mild stenosis. Trace regurgitation. Mitral valve mean gradient of 5 mmHg.  The aortic root and ascending aorta are normal in size.  No pericardial effusion.     Compared to study 05/18/2019, there is no significant change.     Echocardiographic Findings     Left Ventricle The left ventricular size is normal. Mild predominantly basal septal hypertrophy. The ejection fraction by Simpson's biplane method is 57%. There are no segmental wall motion abnormalities. Normal septal motion. Cannot determine left ventricular diastolic function.      Right Ventricle The right ventricular size is normal. The right ventricular wall thickness is normal. PASP is estimated at 24 mmHg.      Left Atrium Normal size.      Right Atrium Normal size.      IVC/SVC Normal central venous pressure (0-5 mm Hg).      Mitral Valve Repaired mitral valve with annuloplasty ring. Mild stenosis. Trace regurgitation. Tricuspid Valve Normal valve  structure. No stenosis. Trace regurgitation.      Aortic Valve Tricuspid aortic valve present.  No stenosis. No regurgitation.      Pulmonary Normal valve structure. No stenosis. Trace regurgitation.      Aorta The aortic root and ascending aorta are normal in size.      Pericardium No pericardial effusion.         Regadenoson thallium stress test 02/01/2020:  Scintigraphic (planar/tomographic):   There is a very small sized mild intensity perfusion defect localized to the distal inferior inferolateral myocardial wall segment that is partially fixed partially reversible.  Limiting the sensitivity/specificity of this finding is increased extracardiac uptake in the territory of this perfusion defect.  There are no other perfusion defect detected.  All myocardial segments appear viable. Polar coordinate map identifies a similar perfusion defect as described above. TID Ratio:  1.13  (normal <1.36). Summed Stress Score:  4   , Summed Rest Score: Regional Wall Thickening and Motion Post Stress:   There is normal left ventricular wall motion and thickening of all myocardial segments inclusive of the perceived perfusion defect in above. Left Ventricular Ejection Fraction (post stress, in the resting state) =  76 %. Left Ventricular End Diastolic Volume: 52 mL  SUMMARY/OPINION:  This study is probably normal with no evidence of significant myocardial ischemia.  There is demonstration of a very small sized very mild intensity perfusion defect localized to the distal/apical inferior inferolateral wall.  Limiting the specificity and sensitivity of this finding is the presence of increased extracardiac uptake in the territory of this perfusion defect.  Moreover, there is continued preservation of myocardial thickening and brightening raise suspicion that this is a attenuation artifact.  However, cannot rule out limited ischemia in this territory.  Left ventricular systolic function is normal. There are no high risk prognostic indicators present.  The pharmacologic ECG portion of the study is negative for ischemia. In aggregate the current study is low risk in regards to predicted annual cardiovascular mortality rate.            Cardiovascular Health Factors  Vitals BP Readings from Last 3 Encounters:   03/01/24 (!) 134/95   11/11/22 (!) 140/90   05/13/22 118/72     Wt Readings from Last 3 Encounters:   03/01/24 71 kg (156 lb 9.6 oz)   11/11/22 92.8 kg (204 lb 9.6 oz)   05/13/22 93.9 kg (207 lb)     BMI Readings from Last 3 Encounters:   03/01/24 27.74 kg/m?   11/11/22 36.24 kg/m?   05/13/22 36.67 kg/m?      Smoking Social History     Tobacco Use   Smoking Status Former    Types: Cigarettes   Smokeless Tobacco Never      Lipid Profile Cholesterol   Date Value Ref Range Status   01/25/2024 224 (H) <200 Final     HDL   Date Value Ref Range Status   01/25/2024 43  Final     LDL   Date Value Ref Range Status   01/25/2024 159 (H) <100 Final     Triglycerides   Date Value Ref Range Status   01/25/2024 110  Final      Blood Sugar No results found for: HGBA1C  Glucose   Date Value Ref Range Status   01/25/2024 93  Final   03/20/2022 106 (H) 70 - 105 Final   01/17/2021 110 (H) 70 - 105 Final  Problems Addressed Today  Encounter Diagnoses   Name Primary?    Screening for heart disease Yes    Hyperlipidemia, unspecified hyperlipidemia type     Essential hypertension     Chest pain, unspecified type     S/P mitral valve repair        Assessment and Plan   I congratulated Ms.Epping on her 50 pound weight loss.  I doubt that any of her tiredness or fatigue is related to her mitral valve disease but I have asked her to obtain a repeat echo Doppler study to reassess for any significant structural cardiac abnormalities.  Her exercise tolerance on the treadmill has been stable or perhaps even improved which would be unusual if she had progression of mitral valve disease.  In the meantime she plans to follow-up with her neurologist concerning the stability of her multiple sclerosis.  Cardiovascular risk factor modification was reviewed in detail.  I have asked her to return for follow-up in 6 months time. The total time spent during this interview and exam with preparation and chart review was 30 minutes.           Current Medications (including today's revisions)   albuterol sulfate (PROAIR HFA) 90 mcg/actuation aerosol inhaler Inhale two puffs by mouth into the lungs as Needed.    ALLEGRA ALLERGY 180 mg tablet Take one tablet by mouth daily.    amantadine (SYMMETREL) 100 mg capsule Take one capsule by mouth twice daily. (Patient not taking: Reported on 03/01/2024)    aspirin EC 81 mg tablet Take one tablet by mouth daily. Take with food.    atorvastatin (LIPITOR) 40 mg tablet Take one tablet by mouth daily. (Patient taking differently: Take one-half tablet by mouth daily.)    bisacodyL (DULCOLAX) 5 mg tablet Take one tablet by mouth every 24 hours as needed for Constipation.    cholecalciferol (VITAMIN D-3) 1,000 units tablet Take one tablet by mouth daily.    duloxetine DR (CYMBALTA) 60 mg capsule Take one capsule by mouth daily.    furosemide (LASIX) 20 mg tablet Take one tablet by mouth daily as needed.    levocetirizine 5 mg tab Take one tablet by mouth daily.    levothyroxine (SYNTHROID) 112 mcg tablet Take one tablet by mouth daily 30 minutes before breakfast.    linaCLOtide (LINZESS) 145 mcg capsule Take one capsule by mouth daily 30 minutes before breakfast.    meclizine (ANTIVERT) 12.5 mg tablet Take one tablet by mouth as Needed for Dizziness.    meloxicam (MOBIC) 7.5 mg tablet Take one tablet by mouth daily.    metoprolol XL (TOPROL XL) 100 mg extended release tablet Take one tablet by mouth daily.    omeprazole DR (PRILOSEC) 40 mg capsule Take one capsule by mouth daily before breakfast. (Patient taking differently: Take one capsule by mouth twice daily.)    ondansetron (ZOFRAN ODT) 4 mg rapid dissolve tablet Dissolve one tablet by mouth as Needed for Nausea.    polyethylene glycol 3350 (MIRALAX) 17 g packet Take one packet by mouth as Needed.    potassium chloride SR (K-DUR) 10 mEq tablet Take one tablet by mouth as Needed. Take with a meal and a full glass of water.    promethazine (PHENERGAN) 25 mg tablet Take one-half tablet by mouth every 6 hours as needed.    semaglutide 2.5 mg/mL solution Inject 1 mL under the skin every 7 days.    valsartan (DIOVAN) 80 mg tablet Take one tablet by mouth  daily.

## 2024-03-01 NOTE — Patient Instructions
 Thank you for visiting our office today.    We would like to make the following medication adjustments:  NONE       Otherwise continue the same medications as you have been doing.          We will be pursuing the following tests after your appointment today:       Orders Placed This Encounter    ECG 12-LEAD    2D + DOPPLER ECHO     Scheduling [phone]    We will plan to see you back in 6 months.  Please call us  in the meantime with any questions or concerns.        Please allow 5-7 business days for our providers to review your results. All normal results will go to MyChart. If you do not have Mychart, it is strongly recommended to get this so you can easily view all your results. If you do not have mychart, we will attempt to call you once with normal lab and testing results. If we cannot reach you by phone with normal results, we will send you a letter.  If you have not heard the results of your testing after one week please give us  a call.       Your [facility] [place] [employer] [employer], [place] F[facility], [place] [employer], and [place])  phone number is [phone].

## 2024-03-30 ENCOUNTER — Ambulatory Visit: Admit: 2024-03-30 | Discharge: 2024-03-30 | Payer: TRICARE (CHAMPUS)

## 2024-03-30 ENCOUNTER — Encounter: Admit: 2024-03-30 | Discharge: 2024-03-30 | Payer: TRICARE (CHAMPUS)

## 2024-03-30 NOTE — Telephone Encounter
 Results and recommendations called to patient lmom requested call back if questions

## 2024-03-30 NOTE — Telephone Encounter
-----   Message from Lindbergh Reusing, MD sent at 03/30/2024  2:55 PM CDT -----  Favorable echo Doppler study.  Good left ventricular systolic function.  Mitral valve repair shows no evidence of mitral valve stenosis with only mild mitral valve regurgitation.  Please let her   know.  Thanks.  SBG  ----- Message -----  From: Marja Sierra, MD  Sent: 03/30/2024   2:50 PM CDT  To: Marja Sierra, MD

## 2024-09-29 ENCOUNTER — Encounter: Admit: 2024-09-29 | Discharge: 2024-09-29 | Payer: TRICARE (CHAMPUS)

## 2024-10-04 ENCOUNTER — Encounter: Admit: 2024-10-04 | Discharge: 2024-10-04 | Payer: TRICARE (CHAMPUS)

## 2024-10-04 VITALS — BP 136/98 | HR 92 | Ht 63.0 in | Wt 152.4 lb

## 2024-10-04 DIAGNOSIS — Z9889 Other specified postprocedural states: Principal | ICD-10-CM

## 2024-10-04 NOTE — Progress Notes [1]
 Date of Service: 10/04/2024    Kristina Wong is a 63 y.o. female.       HPI   Kristina Wong has been followed for mitral valve disease.  She indicates that she has been prone to recurrent sinus infections.  The patient indicates that she started developing signs and symptoms of a sinus infection on 09/27/2024.  KristinaWong indicates that she saw primary care on 10/01/2024 and started steroid therapy and antibiotic therapy with Augmentin on 10/03/2024.  She feels a little better today but has congestion on the right side of her face.  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   She works the night shift from 6 PM to 6 AM.  Other than battling this recent sinus infection, Kristina Wong believes that she is doing well.The patient reports no angina, congestive symptoms, palpitations, sensation of sustained forceful heart pounding, presyncope or syncope.  Her exercise tolerance has been stable.  She continues to use her treadmill for exercise walking 30 minutes 4-5 times per week..  The patient reports no myalgias, bleeding abnormalities, claudication or strokelike symptoms.  She is followed for multiple sclerosis and retinitis pigmentosa.   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  This involved use of a 27 mm Medtronic Durand Ancore annuloplasty ring with a mini sternotomy. Kristina Wong contracted COVID-19 in June 2020. She has since received her Covid vaccines. When I saw her on January 26, 2020 she reported nondiagnostic chest discomfort.  A stress test was obtained without significant abnormality.She reports that she underwent laparoscopic cholecystectomy in March 2022 without complications.  She reports contracting COVID again in December 2023 but was not severely ill.      Objective   Vitals:    10/04/24 1009   BP: (!) 136/98  Comment: has not kept her meds down for a couple days.   BP Source: Arm, Left Upper   Pulse: 92   SpO2: 95%   O2 Device: None (Room air) PainSc: Zero   Weight: 69.1 kg (152 lb 6.4 oz)   Height: 160 cm (5' 3)     Body mass index is 27 kg/m?SABRA     Past Medical History  Patient Active Problem List    Diagnosis Date Noted    Hypothyroid 04/04/2019    Hyperlipemia 04/04/2019    Essential hypertension 04/04/2019    Generalized anxiety disorder 04/04/2019    Chest pain 04/04/2019     07/14/16 Cardiac Cath:  Normal coronary artery anatomy.  Normal left ventricular systolic function.  Minimal elevation of right heart pressures.  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 Silver         Review of Systems   Constitutional: Negative.   HENT: Negative.     Eyes: Negative.    Cardiovascular: Negative.    Respiratory: Negative.     Endocrine: Negative.    Hematologic/Lymphatic: Negative.    Skin: Negative.    Musculoskeletal: Negative.    Gastrointestinal: Negative.    Genitourinary: Negative.    Neurological: Negative.    Psychiatric/Behavioral: Negative.     Allergic/Immunologic: Negative.        Physical Exam  GENERAL: The patient is  well developed, well nourished, resting comfortably and in no distress.   HEENT: The right side of her face does appear diffusely puffy.    NECK: There is no jugular venous distension. Carotids are palpable and without bruits. There is no thyroid enlargement.  Chest: Lung fields are clear to auscultation. There are no wheezes or crackles.  CV: There is a regular rhythm. The first and second heart sounds are normal.  A soft apical systolic murmur is heard.  A soft diastolic rumble is also heard. There are no gallops or rubs.  ABD: The abdomen is soft and supple with normal bowel sounds. There is no hepatosplenomegaly, ascites, tenderness, masses or bruits.  Neuro: There are no focal motor defects. Ambulation is normal. Cognitive function appears normal.  Ext: There is no edema or evidence of deep vein thrombosis. Peripheral pulses are satisfactory.    SKIN: There are no rashes and no cellulitis  PSYCH: The patient is calm, rationale and oriented    Cardiovascular Studies  A twelve-lead ECG obtained on 03/01/2024 reveals normal sinus rhythm with a heart rate of 77 bpm.  There is no evidence of myocardial ischemia or infarction.     Echo Doppler 03/30/2024:  Interpretation Summary  No regional wall motion abnormalities are seen. Overall left ventricular systolic function appears normal. The estimated left ventricular ejection fraction is 60-65%.   It is difficult to assess diastolic function following mitral valve repair.   Right ventricular chamber dimensions and contractility appear normal.  Normal atrial chamber dimensions.  Mitral valve repair is noted.  Doppler exam and visual assessment suggest mild mitral valve regurgitation without evidence for mitral valve stenosis.  The aortic root and the visualized portions of the ascending aorta appear normal in size.  No pericardial effusion is seen.  The estimated peak systolic PA pressure = 30 mmHg.   There has been no significant change when compared to the prior exam performed on 02/10/2022.    Cardiovascular Health Factors  Vitals BP Readings from Last 3 Encounters:   10/04/24 (!) 136/98   03/30/24 (!) 148/85   03/01/24 (!) 134/95     Wt Readings from Last 3 Encounters:   10/04/24 69.1 kg (152 lb 6.4 oz)   03/30/24 70.3 kg (155 lb)   03/01/24 71 kg (156 lb 9.6 oz)     BMI Readings from Last 3 Encounters:   10/04/24 27.00 kg/m?   03/30/24 27.46 kg/m?   03/01/24 27.74 kg/m?      Smoking Tobacco Use History[1]   Lipid Profile Cholesterol   Date Value Ref Range Status   05/02/2024 198  Final     HDL   Date Value Ref Range Status   05/02/2024 57  Final     LDL   Date Value Ref Range Status   05/02/2024 126 (H) <100 Final     Triglycerides   Date Value Ref Range Status   05/02/2024 75  Final      Blood Sugar No results found for: HGBA1C  Glucose   Date Value Ref Range Status   01/25/2024 93  Final   03/20/2022 106 (H) 70 - 105 Final   01/17/2021 110 (H) 70 - 105 Final         Problems Addressed Today  Mitral regurgitation.  Mitral valve repair.  Hypercholesterolemia.    Assessment and Plan   Kristina Wong appears stable from a cardiovascular perspective.  Her echocardiogram from 03/30/2024 shows stable mitral valve repair and she reports no angina  or congestive symptoms.  She indicates that her blood pressure is usually very well-controlled when checked at home.  I have asked her to work closely with primary care for treatment of her recurrent sinus infection.  Cardiovascular risk factor modification was reviewed in detail.  Because she has previously undergone mitral valve repair using an annuloplasty ring, Kristina Wong understands that she requires antibiotic prophylaxis before dental work or other procedures associated with transient bacteremia.  I have asked her to return for follow-up in 6 months time. The total time spent during this interview and exam with preparation and chart review was 30 minutes.          Current Medications (including today's revisions)   albuterol  sulfate (PROAIR  HFA) 90 mcg/actuation aerosol inhaler Inhale two puffs by mouth into the lungs as Needed.    ALLEGRA ALLERGY 180 mg tablet Take one tablet by mouth daily.    amantadine (SYMMETREL) 100 mg capsule Take one capsule by mouth twice daily. (Patient not taking: Reported on 10/04/2024)    aspirin EC 81 mg tablet Take one tablet by mouth daily. Take with food.    atorvastatin  (LIPITOR) 40 mg tablet Take one tablet by mouth daily.    bisacodyL (DULCOLAX) 5 mg tablet Take one tablet by mouth every 24 hours as needed for Constipation.    cholecalciferol (VITAMIN D-3) 1,000 units tablet Take one tablet by mouth daily.    duloxetine DR (CYMBALTA) 60 mg capsule Take one capsule by mouth daily. furosemide (LASIX) 20 mg tablet Take one tablet by mouth daily as needed. (Patient not taking: Reported on 10/04/2024)    levocetirizine 5 mg tab Take one tablet by mouth daily. (Patient not taking: Reported on 10/04/2024)    levothyroxine (SYNTHROID) 112 mcg tablet Take 100 mcg by mouth daily 30 minutes before breakfast.    linaCLOtide (LINZESS) 145 mcg capsule Take one capsule by mouth daily 30 minutes before breakfast.    meclizine (ANTIVERT) 12.5 mg tablet Take one tablet by mouth as Needed for Dizziness.    meloxicam (MOBIC) 7.5 mg tablet Take two tablets by mouth daily.    metoprolol XL (TOPROL XL) 100 mg extended release tablet Take one tablet by mouth daily.    omeprazole DR (PRILOSEC) 40 mg capsule Take one capsule by mouth twice daily.    ondansetron (ZOFRAN ODT) 4 mg rapid dissolve tablet Dissolve one tablet by mouth as Needed for Nausea.    polyethylene glycol 3350 (MIRALAX) 17 g packet Take one packet by mouth as Needed.    potassium chloride SR (K-DUR) 10 mEq tablet Take one tablet by mouth as Needed. Take with a meal and a full glass of water. (Patient not taking: Reported on 10/04/2024)    promethazine (PHENERGAN) 25 mg tablet Take one-half tablet by mouth every 6 hours as needed.    semaglutide 2.5 mg/mL solution Inject 1 mL under the skin every 7 days.    valsartan (DIOVAN) 80 mg tablet Take one tablet by mouth daily.                 [1]   Social History  Tobacco Use   Smoking Status Former    Types: Cigarettes    Passive exposure: Past   Smokeless Tobacco Never

## 2024-10-04 NOTE — Patient Instructions [37]
 Thank you for visiting our office today.    We would like to make the following medication adjustments:  NONE       Otherwise continue the same medications as you have been doing.          We will be pursuing the following tests after your appointment today:            We will plan to see you back in 12 months.  Please call us in the meantime with any questions or concerns.        Please allow 5-7 business days for our providers to review your results. All normal results will go to MyChart. If you do not have Mychart, it is strongly recommended to get this so you can easily view all your results. If you do not have mychart, we will attempt to call you once with normal lab and testing results. If we cannot reach you by phone with normal results, we will send you a letter.  If you have not heard the results of your testing after one week please give [place] a call.       Your [facility] [place] Team Brett Canales, Pilar Jarvis, Shawna [place], and [place])  phone number is [phone].
